# Patient Record
Sex: Female | Born: 1960
Health system: Southern US, Community
[De-identification: ages and names within clinical notes are randomized; demographics above are authoritative.]

## PROBLEM LIST (undated history)

## (undated) DIAGNOSIS — N200 Calculus of kidney: Secondary | ICD-10-CM

## (undated) DIAGNOSIS — G4700 Insomnia, unspecified: Secondary | ICD-10-CM

## (undated) DIAGNOSIS — N393 Stress incontinence (female) (male): Secondary | ICD-10-CM

## (undated) DIAGNOSIS — R112 Nausea with vomiting, unspecified: Secondary | ICD-10-CM

## (undated) DIAGNOSIS — F419 Anxiety disorder, unspecified: Secondary | ICD-10-CM

## (undated) DIAGNOSIS — F32A Depression, unspecified: Secondary | ICD-10-CM

## (undated) DIAGNOSIS — Z9889 Other specified postprocedural states: Secondary | ICD-10-CM

## (undated) DIAGNOSIS — F329 Major depressive disorder, single episode, unspecified: Secondary | ICD-10-CM

## (undated) DIAGNOSIS — G43909 Migraine, unspecified, not intractable, without status migrainosus: Secondary | ICD-10-CM

## (undated) HISTORY — DX: Major depressive disorder, single episode, unspecified: F32.9

## (undated) HISTORY — DX: Depression, unspecified: F32.A

## (undated) HISTORY — DX: Stress incontinence (female) (male): N39.3

## (undated) HISTORY — PX: COLONOSCOPY: SHX174

## (undated) HISTORY — DX: Migraine, unspecified, not intractable, without status migrainosus: G43.909

## (undated) HISTORY — DX: Nausea with vomiting, unspecified: R11.2

## (undated) HISTORY — DX: Insomnia, unspecified: G47.00

## (undated) HISTORY — DX: Other specified postprocedural states: Z98.890

## (undated) HISTORY — DX: Anxiety disorder, unspecified: F41.9

---

## 1997-10-31 ENCOUNTER — Other Ambulatory Visit: Admission: RE | Admit: 1997-10-31 | Discharge: 1997-10-31 | Payer: Self-pay | Admitting: Obstetrics and Gynecology

## 1998-11-08 ENCOUNTER — Other Ambulatory Visit: Admission: RE | Admit: 1998-11-08 | Discharge: 1998-11-08 | Payer: Self-pay | Admitting: Obstetrics and Gynecology

## 1999-11-21 ENCOUNTER — Other Ambulatory Visit: Admission: RE | Admit: 1999-11-21 | Discharge: 1999-11-21 | Payer: Self-pay | Admitting: Obstetrics and Gynecology

## 2000-08-26 ENCOUNTER — Emergency Department (HOSPITAL_COMMUNITY): Admission: EM | Admit: 2000-08-26 | Discharge: 2000-08-27 | Payer: Self-pay | Admitting: Emergency Medicine

## 2000-08-27 ENCOUNTER — Encounter: Payer: Self-pay | Admitting: Emergency Medicine

## 2001-01-03 ENCOUNTER — Other Ambulatory Visit: Admission: RE | Admit: 2001-01-03 | Discharge: 2001-01-03 | Payer: Self-pay | Admitting: Obstetrics and Gynecology

## 2003-02-24 HISTORY — PX: FOOT SURGERY: SHX648

## 2003-06-04 ENCOUNTER — Encounter: Admission: RE | Admit: 2003-06-04 | Discharge: 2003-08-02 | Payer: Self-pay | Admitting: Orthopedic Surgery

## 2003-10-30 ENCOUNTER — Encounter: Admission: RE | Admit: 2003-10-30 | Discharge: 2004-01-28 | Payer: Self-pay | Admitting: Orthopedic Surgery

## 2003-12-19 ENCOUNTER — Other Ambulatory Visit: Admission: RE | Admit: 2003-12-19 | Discharge: 2003-12-19 | Payer: Self-pay | Admitting: Internal Medicine

## 2004-02-24 HISTORY — PX: LAPAROSCOPIC APPENDECTOMY: SUR753

## 2005-02-18 ENCOUNTER — Encounter (INDEPENDENT_AMBULATORY_CARE_PROVIDER_SITE_OTHER): Payer: Self-pay | Admitting: Specialist

## 2005-02-18 ENCOUNTER — Observation Stay (HOSPITAL_COMMUNITY): Admission: EM | Admit: 2005-02-18 | Discharge: 2005-02-20 | Payer: Self-pay | Admitting: Emergency Medicine

## 2005-03-13 ENCOUNTER — Other Ambulatory Visit: Admission: RE | Admit: 2005-03-13 | Discharge: 2005-03-13 | Payer: Self-pay | Admitting: Obstetrics and Gynecology

## 2005-06-09 ENCOUNTER — Encounter: Admission: RE | Admit: 2005-06-09 | Discharge: 2005-06-09 | Payer: Self-pay | Admitting: Obstetrics and Gynecology

## 2005-11-24 ENCOUNTER — Encounter: Admission: RE | Admit: 2005-11-24 | Discharge: 2005-11-24 | Payer: Self-pay | Admitting: Obstetrics and Gynecology

## 2007-12-29 ENCOUNTER — Other Ambulatory Visit: Admission: RE | Admit: 2007-12-29 | Discharge: 2007-12-29 | Payer: Self-pay | Admitting: Obstetrics & Gynecology

## 2008-01-03 ENCOUNTER — Encounter: Admission: RE | Admit: 2008-01-03 | Discharge: 2008-01-03 | Payer: Self-pay | Admitting: Obstetrics & Gynecology

## 2009-03-26 HISTORY — PX: URETHRAL SLING: SHX2621

## 2009-03-28 ENCOUNTER — Ambulatory Visit (HOSPITAL_BASED_OUTPATIENT_CLINIC_OR_DEPARTMENT_OTHER): Admission: RE | Admit: 2009-03-28 | Discharge: 2009-03-28 | Payer: Self-pay | Admitting: Urology

## 2010-02-26 ENCOUNTER — Encounter
Admission: RE | Admit: 2010-02-26 | Discharge: 2010-02-26 | Payer: Self-pay | Source: Home / Self Care | Attending: Obstetrics & Gynecology | Admitting: Obstetrics & Gynecology

## 2010-02-26 LAB — HM MAMMOGRAPHY: HM Mammogram: NORMAL

## 2010-05-14 LAB — CBC
HCT: 40.7 % (ref 36.0–46.0)
MCV: 89.5 fL (ref 78.0–100.0)
RBC: 4.55 MIL/uL (ref 3.87–5.11)
WBC: 4.3 10*3/uL (ref 4.0–10.5)

## 2010-05-14 LAB — TYPE AND SCREEN
ABO/RH(D): AB NEG
Antibody Screen: NEGATIVE

## 2010-05-14 LAB — ABO/RH: ABO/RH(D): AB NEG

## 2010-05-14 LAB — APTT: aPTT: 28 seconds (ref 24–37)

## 2010-07-11 NOTE — H&P (Signed)
Joy Petty, Joy Petty        ACCOUNT NO.:  [id number]   MEDICAL RECORD NO.:  [id number]          PATIENT TYPE:  EMS   LOCATION:  ED                           FACILITY:  [facility]   PHYSICIAN:  Vikki Ports, MDDATE OF BIRTH:  1960/09/06   DATE OF ADMISSION:  02/18/2005  DATE OF DISCHARGE:                                HISTORY & PHYSICAL   CHIEF COMPLAINT:  Right lower quadrant pain.   DIAGNOSIS:  Acute appendicitis.   HISTORY OF PRESENT ILLNESS:  This is a 50 year old white female I was asked  to evaluate by Dr. Crista Luria.  The patient presented with a 10-hour  history of periumbical pain localizing to the right lower quadrant.  She was  sent to the [facility] emergency room and found to have a white count of  15,000.  She was complaining of anorexia and 1 episode of emesis.  She had a  previous history of urolithiasis, but this pain was quite different.  She  complained of pain on the ride over, particularly with bumps in the road.   PAST MEDICAL HISTORY:  Significant only for urolithiasis.   PAST SURGICAL HISTORY:  Significant only for right toe surgery.   SOCIAL HISTORY:  Negative for tobacco and alcohol use.   FAMILY HISTORY:  Noncontributory.   DRUG ALLERGIES:  Significant only for SULFA.   MEDICATIONS:  1.  Wellbutrin 150 mg a day.  2.  Zoloft 10 mg a day.   REVIEW OF SYSTEMS:  Significant for a slight fever this morning at 99  degrees.   PHYSICAL EXAMINATION:  VITAL SIGNS:  Her temperature is 99 degrees, heart  rate is 86 heart beats per minute, respiratory rate is 16, blood pressure is  126/72.  GENERAL:  She is an age-appropriate white female in minimal distress.  HEENT:  Benign.  Normocephalic and atraumatic.  NECK:  Supple and soft without thyromegaly of cervical adenopathy.  LUNGS:  Clear to auscultation and percussion x2.  HEART:  Regular rate and rhythm without murmurs, rubs, or gallops.  ABDOMEN:  Soft.  Tender in the right lower quadrant  with positive rebound  and localized peritoneal signs.  EXTREMITIES:  No clubbing, cyanosis, or edema.  White count is 15,000.   IMPRESSION:  Acute appendicitis.   PLAN:  Laparoscopic appendectomy.      Vikki Ports, MD  Electronically Signed     KRH/MEDQ  D:  02/18/2005  T:  02/18/2005  Job:  [id number]

## 2010-07-11 NOTE — Op Note (Signed)
[name], Joy Petty        ACCOUNT NO.:  [id number]   MEDICAL RECORD NO.:  [id number]          PATIENT TYPE:  OBV   LOCATION:  1514                         FACILITY:  [facility]   PHYSICIAN:  Alfonse Ras, MD   DATE OF BIRTH:  July 30, 1960   DATE OF PROCEDURE:  03/06/2005  DATE OF DISCHARGE:  02/20/2005                                 OPERATIVE REPORT   PREOPERATIVE DIAGNOSIS:  Acute appendicitis.   POSTOPERATIVE DIAGNOSIS:  Acute appendicitis.   PROCEDURE:  Laparoscopic appendectomy.   SURGEON:  Alfonse Ras, MD.   ANESTHESIA:  General.   DESCRIPTION OF PROCEDURE:  The patient was taken to the operating room,  placed in a supine position and after general anesthesia was induced, the  abdomen was prepped and draped in a normal sterile fashion. Using a 12 mm  trocar in the left upper quadrant using direct vision, peritoneal access was  obtained. Pneumoperitoneum was obtained using continuous flow carbon  dioxide. There was some bleeding from the omentum on the access of the  abdomen and this was visualized. Additional 12 and 5 mm trocars were placed  in the left abdomen and the omental bleeding was irrigated and controlled  with the harmonic scalpel. This appeared to be about 400 mL or so. The  suppurative appendix was visualized and mobilized. The mesoappendix was  taken down with harmonic scalpel and the base was transected using a GIA  stapling device, placed in EndoCatch bag and removed through the 12 mm  trocar. Again the left upper quadrant was irrigated and clots were removed.  The area of omental bleeding was again inspected and no active bleeding was  seen but a drain was placed in the area of previous bleeding for further  inspection. The pneumoperitoneum was released, trocars were removed, drain  was sewed in placed. The skin incisions were closed with subcuticular 4-0  Monocryl. Steri-Strips and sterile dressings were applied. The patient  tolerated the  procedure well and went to PACU in good condition.      Alfonse Ras, MD  Electronically Signed     KRE/MEDQ  D:  03/06/2005  T:  03/07/2005  Job:  [id number]

## 2011-02-12 LAB — HM PAP SMEAR: HM Pap smear: NEGATIVE

## 2011-03-10 ENCOUNTER — Institutional Professional Consult (permissible substitution): Payer: Self-pay | Admitting: Cardiology

## 2012-01-04 ENCOUNTER — Ambulatory Visit: Payer: Self-pay | Admitting: Cardiology

## 2012-01-15 ENCOUNTER — Ambulatory Visit (INDEPENDENT_AMBULATORY_CARE_PROVIDER_SITE_OTHER): Payer: BC Managed Care – PPO | Admitting: Cardiology

## 2012-01-15 ENCOUNTER — Encounter: Payer: Self-pay | Admitting: Cardiology

## 2012-01-15 ENCOUNTER — Ambulatory Visit (INDEPENDENT_AMBULATORY_CARE_PROVIDER_SITE_OTHER)
Admission: RE | Admit: 2012-01-15 | Discharge: 2012-01-15 | Disposition: A | Discharge status: Home / Self Care | Payer: BC Managed Care – PPO | Source: Ambulatory Visit | Attending: Cardiology | Admitting: Cardiology

## 2012-01-15 VITALS — BP 130/81 | HR 88 | Ht 62.0 in | Wt 166.0 lb

## 2012-01-15 DIAGNOSIS — R0989 Other specified symptoms and signs involving the circulatory and respiratory systems: Secondary | ICD-10-CM

## 2012-01-15 DIAGNOSIS — R0600 Dyspnea, unspecified: Secondary | ICD-10-CM

## 2012-01-15 DIAGNOSIS — R0609 Other forms of dyspnea: Secondary | ICD-10-CM

## 2012-01-15 DIAGNOSIS — Z8249 Family history of ischemic heart disease and other diseases of the circulatory system: Secondary | ICD-10-CM | POA: Insufficient documentation

## 2012-01-15 NOTE — Patient Instructions (Signed)
Your physician recommends that you continue on your current medications as directed. Please refer to the Current Medication list given to you today.  Will have you go for a chest xray today and call you with the results  Return soon for fasting labs (lipid panel)  Your physician has requested that you have an exercise tolerance test. For further information please visit [email or web address] Please also follow instruction sheet, as given.

## 2012-01-15 NOTE — Progress Notes (Signed)
Reason for Consult: Risk factors for coronary artery disease Referring Physician: Dr. Leda Quail  Joy Petty is an 51 y.o. female.  HPI: This patient is seen for the first time today.  Her mother is a patient of mine and her father was a patient of mine before his death.  The patient comes in with her concerned about her strong family history of ischemic heart disease.  She was to be sure that she is doing all that she can do to keep from having ischemic heart disease herself.  Her family history shows that her father developed ischemic heart disease in his 89s and had coronary artery bypass graft surgery.  Her paternal grandfather had a myocardial infarction and early cardiac death.  She has a living 69 year old brother who has had multiple cardiac stents.  Her mother is in good health and has not had ischemic heart disease. The patient has had some exertional dyspnea.  She has not been experiencing any precordial chest pain to suggest angina.  He does not have any prior history of hypercholesterolemia that she knows of.  She has not had problems with high blood pressure or diabetes.  Her weight has been up and down over the years.  She does try to exercise by walking about 4 times a week and also does yoga Her social history reveals that she is married.  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 Her past medical history is positive for problems with anxiety and depression and at the present time she is on Pristiq.      Social History: The patient does not use tobacco and she does not drink alcohol        Dg Chest 2 View  01/15/2012  *RADIOLOGY REPORT*  Clinical Data: Dyspnea  CHEST - 2 VIEW  Comparison: None.  Findings: Lungs are clear. No pleural effusion or pneumothorax.  Cardiomediastinal silhouette is within normal limits.  Mild degenerative changes of the visualized thoracolumbar spine.  IMPRESSION: No evidence of  acute cardiopulmonary disease.   Original Report Authenticated By: Charline Bills, M.D.    Review of systems is negative except for present illness Blood pressure 130/81, pulse 88, height 5\' 2"  (1.575 m), weight 166 lb (75.297 kg). The patient appears to be in no distress.  Head and neck exam reveals that the pupils are equal and reactive.  The extraocular movements are full.  There is no scleral icterus.  Mouth and pharynx are benign.  No lymphadenopathy.  No carotid bruits.  The jugular venous pressure is normal.  Thyroid is not enlarged or tender.  Chest is clear to percussion and auscultation.  No rales or rhonchi.  Expansion of the chest is symmetrical.  Heart reveals no abnormal lift or heave.  First and second heart sounds are normal.  There is no murmur gallop rub or click.  The abdomen is soft and nontender.  Bowel sounds are normoactive.  There is no hepatosplenomegaly or mass.  There are no abdominal bruits.  Extremities reveal no phlebitis or edema.  Pedal pulses are good.  There is no cyanosis or clubbing.  Neurologic exam is normal strength and no lateralizing weakness.  No sensory deficits.  Integument reveals no rash   Assessment/Plan: Her physical examination is unremarkable.  Her electrocardiogram is normal today.  We will assess additional risk factors with a chest x-ray to look at heart size and she will return for a fasting lipid  panel soon.  We will also have her return for a Bruce treadmill stress test to evaluate her for her history of mild exertional dyspnea.  Many thanks for the opportunity to see this pleasant woman with you.  No new medications indicated at this point.  Cassell Clement 01/15/2012, 5:59 PM

## 2012-01-19 ENCOUNTER — Telehealth: Payer: Self-pay | Admitting: *Deleted

## 2012-01-19 NOTE — Telephone Encounter (Signed)
Message copied by Burnell Blanks on Tue Jan 19, 2012  5:44 PM ------      Message from: Cassell Clement      Created: Sun Jan 17, 2012  5:49 PM       Chest xray normal.  Heart size normal.

## 2012-01-27 ENCOUNTER — Other Ambulatory Visit (INDEPENDENT_AMBULATORY_CARE_PROVIDER_SITE_OTHER): Payer: BC Managed Care – PPO

## 2012-01-27 DIAGNOSIS — R0609 Other forms of dyspnea: Secondary | ICD-10-CM

## 2012-01-27 DIAGNOSIS — R0989 Other specified symptoms and signs involving the circulatory and respiratory systems: Secondary | ICD-10-CM

## 2012-01-27 DIAGNOSIS — R0600 Dyspnea, unspecified: Secondary | ICD-10-CM

## 2012-01-27 LAB — LIPID PANEL
Cholesterol: 177 mg/dL (ref 0–200)
LDL Cholesterol: 110 mg/dL — ABNORMAL HIGH (ref 0–99)

## 2012-02-01 ENCOUNTER — Ambulatory Visit (INDEPENDENT_AMBULATORY_CARE_PROVIDER_SITE_OTHER): Payer: BC Managed Care – PPO | Admitting: Physician Assistant

## 2012-02-01 DIAGNOSIS — R0600 Dyspnea, unspecified: Secondary | ICD-10-CM

## 2012-02-01 DIAGNOSIS — R0609 Other forms of dyspnea: Secondary | ICD-10-CM

## 2012-02-01 NOTE — Progress Notes (Signed)
Exercise Treadmill Test  Pre-Exercise Testing Evaluation Rhythm: normal sinus  Rate: 78                 Test  Exercise Tolerance Test Ordering MD: Cassell Clement, MD  Interpreting MD: Tereso Newcomer, PA-C  Unique Test No: 1  Treadmill:  1  Indication for ETT: exertional dyspnea  Contraindication to ETT: No   Stress Modality: exercise - treadmill  Cardiac Imaging Performed: non   Protocol: standard Bruce - maximal  Max BP:  183/82  Max MPHR (bpm):  167 85% MPR (bpm):  142  MPHR obtained (bpm):  162 % MPHR obtained:  98  Reached 85% MPHR (min:sec):  3:41   Total Exercise Time (min-sec):  7:01  Workload in METS:  8.5 Borg Scale: 16  Reason ETT Terminated:  patient's desire to stop    ST Segment Analysis At Rest: normal ST segments - no evidence of significant ST depression With Exercise: non-specific ST changes  Other Information Arrhythmia:  No Angina during ETT:  absent (0) Quality of ETT:  diagnostic  ETT Interpretation:  normal - no evidence of ischemia by ST analysis  Comments: Fair exercise tolerance. No chest pain. Normal BP response to exercise. No ST-T changes to suggest ischemia.   Recommendations: Follow up with Dr. Cassell Clement as directed. Signed, Tereso Newcomer, PA-C  3:20 PM 02/01/2012

## 2012-02-01 NOTE — Progress Notes (Signed)
Stress test was normal.  Please report.

## 2012-02-25 ENCOUNTER — Telehealth: Payer: Self-pay | Admitting: Cardiology

## 2012-02-25 NOTE — Telephone Encounter (Signed)
New Problem:    Patient returned Melinda's previous call.  Please call back.

## 2012-02-25 NOTE — Telephone Encounter (Signed)
I spoke with the pt and made her aware of 01/27/12 lipid results.  I also made the pt aware that Dr Patty Sermons reviewed her GXT and this was normal. Copy of labs mailed to the pt per her request.

## 2012-06-14 ENCOUNTER — Encounter: Payer: Self-pay | Admitting: Obstetrics & Gynecology

## 2012-06-16 ENCOUNTER — Ambulatory Visit (INDEPENDENT_AMBULATORY_CARE_PROVIDER_SITE_OTHER): Payer: BC Managed Care – PPO | Admitting: Obstetrics & Gynecology

## 2012-06-16 ENCOUNTER — Encounter: Payer: Self-pay | Admitting: Obstetrics & Gynecology

## 2012-06-16 VITALS — BP 128/82 | Ht 63.0 in | Wt 174.8 lb

## 2012-06-16 DIAGNOSIS — Z0000 Encounter for general adult medical examination without abnormal findings: Secondary | ICD-10-CM

## 2012-06-16 DIAGNOSIS — Z01419 Encounter for gynecological examination (general) (routine) without abnormal findings: Secondary | ICD-10-CM

## 2012-06-16 LAB — POCT URINALYSIS DIPSTICK
Bilirubin, UA: NEGATIVE
Glucose, UA: NEGATIVE
Nitrite, UA: NEGATIVE
Urobilinogen, UA: NEGATIVE
pH, UA: 5

## 2012-06-16 LAB — COMPREHENSIVE METABOLIC PANEL
ALT: 21 U/L (ref 0–35)
BUN: 16 mg/dL (ref 6–23)
CO2: 28 mEq/L (ref 19–32)
Calcium: 9.8 mg/dL (ref 8.4–10.5)
Chloride: 103 mEq/L (ref 96–112)
Creat: 0.69 mg/dL (ref 0.50–1.10)
Glucose, Bld: 83 mg/dL (ref 70–99)
Total Bilirubin: 0.6 mg/dL (ref 0.3–1.2)

## 2012-06-16 LAB — TSH: TSH: 2.364 u[IU]/mL (ref 0.350–4.500)

## 2012-06-16 MED ORDER — PHENTERMINE HCL 37.5 MG PO CAPS: 37.5000 mg | ORAL_CAPSULE | ORAL | Status: DC

## 2012-06-16 MED ORDER — DESVENLAFAXINE SUCCINATE ER 50 MG PO TB24: 50.0000 mg | ORAL_TABLET | Freq: Every day | ORAL | Status: DC

## 2012-06-16 NOTE — Patient Instructions (Signed)

## 2012-06-16 NOTE — Progress Notes (Signed)
52 y.o. [id number] MarriedCaucasianF here for annual exam.  No vaginal bleeding.  Going to [place] in September.  Going for 10-14 days.  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    Patient's last menstrual period was 02/23/2009.          Sexually active: yes  The current method of family planning is vasectomy.    Exercising: yes  walking and exercise videos Smoker:  no  Health Maintenance: Pap:  02/12/11 WNL/negative HR HPV MMG:  02/26/10 normal Colonoscopy:  3/12 repeat 10 years BMD:   Heel test TDaP:  2005 Labs: Doing today   reports that she has never smoked. She has never used smokeless tobacco. She reports that she drinks about 6.0 ounces of alcohol per week. She reports that she does not use illicit drugs.  Past Medical History  Diagnosis Date  . Depression     anxiety  . Insomnia     Past Surgical History  Procedure Laterality Date  . Laparoscopic appendectomy  2006  . Foot surgery  2005    right foot  . Colonoscopy  3/12  . Urethral sling  2/11    Dr Sherron Monday     Current Outpatient Prescriptions  Medication Sig Dispense Refill  . desvenlafaxine (PRISTIQ) 50 MG 24 hr tablet Take 50 mg by mouth daily.      . diphenhydramine-acetaminophen (TYLENOL PM) 25-500 MG TABS Take 1 tablet by mouth at bedtime as needed.      . Nutritional Supplements (JUICE PLUS FIBRE PO) Take by mouth.      . Pseudoeph-Doxylamine-DM-APAP (NYQUIL PO) Take by mouth as needed.       No current facility-administered medications for this visit.    Family History  Problem Relation Age of Onset  . Hypertension Mother   . Thyroid disease Mother   . Cancer Mother     lung  . Diabetes Paternal Grandfather   . Heart disease Father   . Heart disease Brother     ROS:  Pertinent items are noted in HPI.  Otherwise, a comprehensive ROS was negative.  Exam:   BP 128/82  Ht 5\' 3"  (1.6 m)  Wt 174 lb 12.8 oz (79.289 kg)  BMI 30.97 kg/m2  LMP 02/23/2009  W Height:   Height: 5\' 3"  (160 cm)  Ht Readings  from Last 3 Encounters:  06/16/12 5\' 3"  (1.6 m)  01/15/12 5\' 2"  (1.575 m)    General appearance: alert, cooperative and appears stated age Head: Normocephalic, without obvious abnormality, atraumatic Neck: no adenopathy, supple, symmetrical, trachea midline and thyroid normal to inspection and palpation Lungs: clear to auscultation bilaterally Breasts: normal appearance, no masses or tenderness Heart: regular rate and rhythm Abdomen: soft, non-tender; bowel sounds normal; no masses,  no organomegaly Extremities: extremities normal, atraumatic, no cyanosis or edema Skin: Skin color, texture, turgor normal. No rashes or lesions Lymph nodes: Cervical, supraclavicular, and axillary nodes normal. No abnormal inguinal nodes palpated Neurologic: Grossly normal   Pelvic: External genitalia:  no lesions              Urethra:  normal appearing urethra with no masses, tenderness or lesions              Bartholins and Skenes: normal                 Vagina: normal appearing vagina with normal color and discharge, no lesions              Cervix: no lesions  Pap taken: no Bimanual Exam:  Uterus:  normal size, contour, position, consistency, mobility, non-tender              Adnexa: no mass, fullness, tenderness               Rectovaginal: Confirms               Anus:  normal sphincter tone, no lesions  A:  Well Woman with normal exam Strong family hx of CVD--saw Dr. Patty Sermons 2013 PMP, no HRT OAB after mid urethral sling placement--not on medication Desirous of weight loss H/O depression/anxiety  P:   Mammogram, we need to schedule for pt pap smear 2013 neg with neg HR HPV Prestiq 50mg  qd Restart phentermine 37.5mg  q day.  Recheck 6 wks CMP, TSH, Vit D return annually or prn  An After Visit Summary was printed and given to the patient.

## 2012-06-17 ENCOUNTER — Telehealth: Payer: Self-pay | Admitting: Obstetrics & Gynecology

## 2012-06-17 ENCOUNTER — Other Ambulatory Visit: Payer: Self-pay | Admitting: Obstetrics & Gynecology

## 2012-06-17 DIAGNOSIS — Z1231 Encounter for screening mammogram for malignant neoplasm of breast: Secondary | ICD-10-CM

## 2012-06-17 NOTE — Telephone Encounter (Signed)
Spoke with pt who reports she was able to get her questions answered. No further needs at this time.

## 2012-06-17 NOTE — Telephone Encounter (Signed)
pt was seen yesterday and has some questions

## 2012-06-21 ENCOUNTER — Ambulatory Visit
Admission: RE | Admit: 2012-06-21 | Discharge: 2012-06-21 | Disposition: A | Discharge status: Home / Self Care | Payer: BC Managed Care – PPO | Source: Ambulatory Visit | Attending: Obstetrics & Gynecology | Admitting: Obstetrics & Gynecology

## 2012-06-21 DIAGNOSIS — Z1231 Encounter for screening mammogram for malignant neoplasm of breast: Secondary | ICD-10-CM

## 2012-07-28 ENCOUNTER — Other Ambulatory Visit: Payer: BC Managed Care – PPO

## 2012-07-29 ENCOUNTER — Telehealth: Payer: Self-pay | Admitting: Obstetrics & Gynecology

## 2012-07-29 NOTE — Telephone Encounter (Signed)
Patient DNKA 6 week lab appointment 07/28/12. I left a message for the patient to call and reschedule.

## 2012-10-15 ENCOUNTER — Other Ambulatory Visit: Payer: Self-pay | Admitting: Obstetrics & Gynecology

## 2012-11-03 ENCOUNTER — Telehealth: Payer: Self-pay | Admitting: Obstetrics & Gynecology

## 2012-11-03 NOTE — Telephone Encounter (Signed)
Patient needs to know if she needs lab work because of the medication that she is taking. Phentermine?

## 2012-11-03 NOTE — Telephone Encounter (Signed)
Spoke with pt who started taking phentermine about 5 weeks ago. Pt did not start it right after it was prescribed so she cancelled her lab appt on 07-28-12 that was to be 6 weeks after last visit. Pt wanted to reschedule lab appt, but she does not know what labs she needs. Please advise and I will schedule.

## 2012-11-03 NOTE — Telephone Encounter (Signed)
Not labs in 6 weeks.  Recheck with me in 6 weeks.  Please make appt.  She will be weighed when she comes in and I will check BP and see if she is having any side effects.

## 2012-11-04 NOTE — Telephone Encounter (Signed)
Spoke with pt about follow up appt for weight, BP check, and assessment for side effects on med. Sched OV 11-07-12 at 2 pm with SM.

## 2012-11-04 NOTE — Telephone Encounter (Signed)
LMTCB about appt to see SM for checkup.  aa

## 2012-11-07 ENCOUNTER — Ambulatory Visit (INDEPENDENT_AMBULATORY_CARE_PROVIDER_SITE_OTHER): Payer: BC Managed Care – PPO | Admitting: Obstetrics & Gynecology

## 2012-11-07 VITALS — BP 124/78 | HR 60 | Resp 16 | Ht 62.75 in | Wt 171.0 lb

## 2012-11-07 DIAGNOSIS — F3289 Other specified depressive episodes: Secondary | ICD-10-CM

## 2012-11-07 DIAGNOSIS — F32A Depression, unspecified: Secondary | ICD-10-CM

## 2012-11-07 DIAGNOSIS — F329 Major depressive disorder, single episode, unspecified: Secondary | ICD-10-CM

## 2012-11-07 DIAGNOSIS — R51 Headache: Secondary | ICD-10-CM

## 2012-11-07 MED ORDER — DESVENLAFAXINE SUCCINATE ER 100 MG PO TB24: ORAL_TABLET | ORAL | Status: DC

## 2012-11-07 NOTE — Progress Notes (Signed)
52 y.o. Married Caucasian female [phone] here for follow up of desired weight loss.  She started the phentermine about six weeks ago.  Has not had much weight loss but reports worsening headaches on the phentermine.  Headaches have been a big issue for pt even before starting the phentermine.  Wants recommendations.  Has never had w/u for this.  Feel she needs referral to [facility] for this.  Pt going to [place] next week and going to [place] for 3 days.  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  Very excited just hopes she feels well while there.  Advised stopping phentermine today.  Pt understands.  Second issue she wants to discuss  is depression that she has struggled with for years.  On Prestiq 50mg  daily but not sure that is doing her any good.  She, particularly, missed her daughter.  Relationship is now good although daughter has caused significant stressors for patient in past, an even occasionally now.  Son doing well.  Married.  Relationship with him good, too, although he is often jealous of how she talks about missing daughter.  Relationship with husband good.  Pt not really interested in therapist or psychiatrist but may need to go if changes to medication do not help.  No homicidal or suicidal ideation.    O: Healthy WD,WN female Affect: normal  A:Headaches Depression  P: Stop phentermine [facility] Increase prestiq to 100mg  daily.  Rx to pharmacy.    ~25 minutes spent with patient >50% of time was in face to face discussion of above.

## 2012-11-19 ENCOUNTER — Encounter: Payer: Self-pay | Admitting: Obstetrics & Gynecology

## 2012-11-19 DIAGNOSIS — R519 Headache, unspecified: Secondary | ICD-10-CM | POA: Insufficient documentation

## 2012-11-19 DIAGNOSIS — R51 Headache: Secondary | ICD-10-CM | POA: Insufficient documentation

## 2012-11-19 NOTE — Patient Instructions (Signed)
Please call and give and update once you have increased the prestiq.  Call in 3-4 weeks.

## 2012-12-08 ENCOUNTER — Telehealth: Payer: Self-pay | Admitting: Obstetrics & Gynecology

## 2012-12-08 NOTE — Telephone Encounter (Signed)
Called [facility] to follow up on a referral for this patient. She had not responded to the letter they sent her.   Called the patient to follow up with her in regards to the referral we submitted for her. She said [place] was out [place] when they tried to contact her about scheduling. She said she stopped taking phentermine and has not had a headache since. Patient will contact the headache clinic if they return and schedule at that time.

## 2013-08-30 ENCOUNTER — Ambulatory Visit: Payer: BC Managed Care – PPO | Admitting: Obstetrics & Gynecology

## 2013-09-01 ENCOUNTER — Ambulatory Visit: Payer: BC Managed Care – PPO | Admitting: Obstetrics & Gynecology

## 2013-09-01 ENCOUNTER — Telehealth: Payer: Self-pay | Admitting: Obstetrics & Gynecology

## 2013-09-01 NOTE — Telephone Encounter (Signed)
Left message for patient with her husband cancelling her appointment with Dr. Jeanie Cooks today. Patient will call back to reschedule.

## 2013-09-01 NOTE — Telephone Encounter (Signed)
Rescheduled

## 2013-10-23 ENCOUNTER — Encounter: Payer: Self-pay | Admitting: Obstetrics & Gynecology

## 2013-10-23 ENCOUNTER — Ambulatory Visit (INDEPENDENT_AMBULATORY_CARE_PROVIDER_SITE_OTHER): Payer: 59 | Admitting: Obstetrics & Gynecology

## 2013-10-23 VITALS — BP 120/80 | HR 72 | Ht 62.5 in | Wt 177.0 lb

## 2013-10-23 DIAGNOSIS — Z01419 Encounter for gynecological examination (general) (routine) without abnormal findings: Secondary | ICD-10-CM

## 2013-10-23 DIAGNOSIS — Z23 Encounter for immunization: Secondary | ICD-10-CM

## 2013-10-23 DIAGNOSIS — M79674 Pain in right toe(s): Secondary | ICD-10-CM

## 2013-10-23 DIAGNOSIS — Z0000 Encounter for general adult medical examination without abnormal findings: Secondary | ICD-10-CM

## 2013-10-23 DIAGNOSIS — Z1239 Encounter for other screening for malignant neoplasm of breast: Secondary | ICD-10-CM

## 2013-10-23 DIAGNOSIS — M79609 Pain in unspecified limb: Secondary | ICD-10-CM

## 2013-10-23 DIAGNOSIS — Z124 Encounter for screening for malignant neoplasm of cervix: Secondary | ICD-10-CM

## 2013-10-23 LAB — POCT URINALYSIS DIPSTICK
Bilirubin, UA: NEGATIVE
Glucose, UA: NEGATIVE
Ketones, UA: NEGATIVE
Nitrite, UA: NEGATIVE
PH UA: 5
PROTEIN UA: NEGATIVE
UROBILINOGEN UA: NEGATIVE

## 2013-10-23 LAB — HEMOGLOBIN, FINGERSTICK: Hemoglobin, fingerstick: 14.7 g/dL (ref 12.0–16.0)

## 2013-10-23 LAB — URIC ACID: Uric Acid, Serum: 5.6 mg/dL (ref 2.4–7.0)

## 2013-10-23 MED ORDER — TETANUS-DIPHTH-ACELL PERTUSSIS 5-2.5-18.5 LF-MCG/0.5 IM SUSP: 0.5000 mL | Freq: Once | INTRAMUSCULAR | Status: DC

## 2013-10-23 MED ORDER — DESVENLAFAXINE SUCCINATE ER 100 MG PO TB24: ORAL_TABLET | ORAL | Status: DC

## 2013-10-23 NOTE — Progress Notes (Signed)
53 y.o. [id number] MarriedCaucasianF here for annual exam.  No vaginal bleeding.  Doing well except reports issues with pain and swelling in right big toe.  H/O arthritis in right knee.  Has noted some heat in her toe as well.   Patient's last menstrual period was 02/23/2009.          Sexually active: No.  The current method of family planning is post menopausal status.    Exercising: Yes.    Home exercise routine includes walking and exercise videos. Smoker:  no  Health Maintenance: Pap:  02/12/11 WNL neg HR HPV History of abnormal Pap:  no MMG:  06/21/12 Bi-Rads Neg Colonoscopy:  3/12 repeat in 10 years (per pt), [facility] BMD:   Heel test a long time ago TDaP:  2005 Screening Labs: lipids 2013 and CMP/TSH/Vit D 2014, Hb today: 14.7, Urine today: RBC-trace, WBC-trace, ph: 5.0   reports that she has never smoked. She has never used smokeless tobacco. She reports that she drinks about 6 ounces of alcohol per week. She reports that she does not use illicit drugs.  Past Medical History  Diagnosis Date  . Depression     anxiety  . Insomnia   . SUI (stress urinary incontinence, female)     Past Surgical History  Procedure Laterality Date  . Laparoscopic appendectomy  2006  . Foot surgery  2005    right foot  . Urethral sling  2/11    Dr Matilde Sprang     Current Outpatient Prescriptions  Medication Sig Dispense Refill  . desvenlafaxine (PRISTIQ) 100 MG 24 hr tablet 1 po q day  30 tablet  6  . diphenhydramine-acetaminophen (TYLENOL PM) 25-500 MG TABS Take 1 tablet by mouth at bedtime as needed.      . Nutritional Supplements (JUICE PLUS FIBRE PO) Take by mouth.      . Pseudoeph-Doxylamine-DM-APAP (NYQUIL PO) Take by mouth as needed.       No current facility-administered medications for this visit.    Family History  Problem Relation Age of Onset  . Hypertension Mother   . Thyroid disease Mother   . Cancer Mother     lung  . Diabetes Paternal Grandfather   .  Heart disease Father   . Heart disease Brother     ROS:  Pertinent items are noted in HPI.  Otherwise, a comprehensive ROS was negative.  Exam:   BP 120/80  Pulse 72  Ht 5' 2.5" (1.588 m)  Wt 177 lb (80.287 kg)  BMI 31.84 kg/m2  LMP 02/23/2009  Weight change: +3#  Height: 5' 2.5" (158.8 cm)  Ht Readings from Last 3 Encounters:  10/23/13 5' 2.5" (1.588 m)  11/07/12 5' 2.75" (1.594 m)  06/16/12 5\' 3"  (1.6 m)    General appearance: alert, cooperative and appears stated age Head: Normocephalic, without obvious abnormality, atraumatic Neck: no adenopathy, supple, symmetrical, trachea midline and thyroid normal to inspection and palpation Lungs: clear to auscultation bilaterally Breasts: normal appearance, no masses or tenderness Heart: regular rate and rhythm Abdomen: soft, non-tender; bowel sounds normal; no masses,  no organomegaly Extremities: extremities normal, atraumatic, no cyanosis or edema Skin: Skin color, texture, turgor normal. No rashes or lesions Lymph nodes: Cervical, supraclavicular, and axillary nodes normal. No abnormal inguinal nodes palpated Neurologic: Grossly normal   Pelvic: External genitalia:  no lesions except for a single sebaceous cyst in right labia              Urethra:  normal  appearing urethra with no masses, tenderness or lesions              Bartholins and Skenes: normal                 Vagina: normal appearing vagina with normal color and discharge, no lesions              Cervix: no lesions              Pap taken: Yes.   Bimanual Exam:  Uterus:  normal size, contour, position, consistency, mobility, non-tender              Adnexa: normal adnexa and no mass, fullness, tenderness               Rectovaginal: Confirms               Anus:  normal sphincter tone, no lesions  A:  Well Woman with normal exam  Strong family hx of CVD--saw Dr. Mare Ferrari 2013  PMP, no HRT  OAB after mid urethral sling placement--not on medication  Desirous of weight  loss  H/O depression/anxiety Right toe pain   P: Mammogram, we need to schedule for pt  pap smear 2012 neg with neg HR HPV.  Pap today. Prestiq 50mg  qd   Uric acid.  If normal, may need referral to ortho. return annually or prn  An After Visit Summary was printed and given to the patient.

## 2013-10-23 NOTE — Addendum Note (Signed)
Addended by: Gerda Diss on: 10/23/2013 03:17 PM   Modules accepted: Orders

## 2013-10-25 LAB — IPS PAP TEST WITH HPV

## 2013-11-09 ENCOUNTER — Ambulatory Visit: Payer: BC Managed Care – PPO

## 2013-11-28 ENCOUNTER — Ambulatory Visit
Admission: RE | Admit: 2013-11-28 | Discharge: 2013-11-28 | Disposition: A | Discharge status: Home / Self Care | Payer: 59 | Source: Ambulatory Visit | Attending: Obstetrics & Gynecology | Admitting: Obstetrics & Gynecology

## 2013-11-28 ENCOUNTER — Encounter (INDEPENDENT_AMBULATORY_CARE_PROVIDER_SITE_OTHER): Payer: Self-pay

## 2013-11-28 ENCOUNTER — Other Ambulatory Visit: Payer: Self-pay | Admitting: Obstetrics & Gynecology

## 2013-11-28 DIAGNOSIS — Z1239 Encounter for other screening for malignant neoplasm of breast: Secondary | ICD-10-CM

## 2013-11-28 DIAGNOSIS — Z1231 Encounter for screening mammogram for malignant neoplasm of breast: Secondary | ICD-10-CM

## 2013-12-25 ENCOUNTER — Encounter: Payer: Self-pay | Admitting: Obstetrics & Gynecology

## 2014-05-25 HISTORY — PX: LITHOTRIPSY: SUR834

## 2014-05-25 HISTORY — PX: CYSTOSCOPY/RETROGRADE/URETEROSCOPY/STONE EXTRACTION WITH BASKET: SHX5317

## 2014-06-08 ENCOUNTER — Encounter (HOSPITAL_BASED_OUTPATIENT_CLINIC_OR_DEPARTMENT_OTHER): Payer: Self-pay

## 2014-06-08 ENCOUNTER — Emergency Department (HOSPITAL_BASED_OUTPATIENT_CLINIC_OR_DEPARTMENT_OTHER)
Admission: EM | Admit: 2014-06-08 | Discharge: 2014-06-08 | Disposition: A | Discharge status: Home / Self Care | Payer: 59 | Attending: Emergency Medicine | Admitting: Emergency Medicine

## 2014-06-08 ENCOUNTER — Emergency Department (HOSPITAL_BASED_OUTPATIENT_CLINIC_OR_DEPARTMENT_OTHER): Payer: 59

## 2014-06-08 DIAGNOSIS — N10 Acute tubulo-interstitial nephritis: Secondary | ICD-10-CM | POA: Insufficient documentation

## 2014-06-08 DIAGNOSIS — Z9049 Acquired absence of other specified parts of digestive tract: Secondary | ICD-10-CM | POA: Diagnosis not present

## 2014-06-08 DIAGNOSIS — R109 Unspecified abdominal pain: Secondary | ICD-10-CM

## 2014-06-08 DIAGNOSIS — F329 Major depressive disorder, single episode, unspecified: Secondary | ICD-10-CM | POA: Diagnosis not present

## 2014-06-08 DIAGNOSIS — N200 Calculus of kidney: Secondary | ICD-10-CM | POA: Diagnosis not present

## 2014-06-08 DIAGNOSIS — Z79899 Other long term (current) drug therapy: Secondary | ICD-10-CM | POA: Insufficient documentation

## 2014-06-08 DIAGNOSIS — Z8669 Personal history of other diseases of the nervous system and sense organs: Secondary | ICD-10-CM | POA: Diagnosis not present

## 2014-06-08 DIAGNOSIS — F419 Anxiety disorder, unspecified: Secondary | ICD-10-CM | POA: Diagnosis not present

## 2014-06-08 DIAGNOSIS — R1030 Lower abdominal pain, unspecified: Secondary | ICD-10-CM | POA: Diagnosis present

## 2014-06-08 HISTORY — DX: Calculus of kidney: N20.0

## 2014-06-08 LAB — URINALYSIS, ROUTINE W REFLEX MICROSCOPIC
Bilirubin Urine: NEGATIVE
GLUCOSE, UA: NEGATIVE mg/dL
Ketones, ur: 15 mg/dL — AB
LEUKOCYTES UA: NEGATIVE
NITRITE: NEGATIVE
Protein, ur: NEGATIVE mg/dL
SPECIFIC GRAVITY, URINE: 1.006 (ref 1.005–1.030)
Urobilinogen, UA: 0.2 mg/dL (ref 0.0–1.0)
pH: 6 (ref 5.0–8.0)

## 2014-06-08 LAB — BASIC METABOLIC PANEL
ANION GAP: 9 (ref 5–15)
BUN: 17 mg/dL (ref 6–23)
CO2: 26 mmol/L (ref 19–32)
CREATININE: 0.83 mg/dL (ref 0.50–1.10)
Calcium: 9.3 mg/dL (ref 8.4–10.5)
Chloride: 101 mmol/L (ref 96–112)
GFR calc Af Amer: >90 mL/min (ref >90)
GFR calc non Af Amer: 78 mL/min — ABNORMAL LOW (ref >90)
GLUCOSE: 100 mg/dL — AB (ref 70–99)
Potassium: 3.8 mmol/L (ref 3.5–5.1)
Sodium: 136 mmol/L (ref 135–145)

## 2014-06-08 LAB — URINE MICROSCOPIC-ADD ON

## 2014-06-08 MED ORDER — ONDANSETRON 4 MG PO TBDP: 4.0000 mg | ORAL_TABLET | Freq: Three times a day (TID) | ORAL | Status: DC | PRN

## 2014-06-08 MED ORDER — KETOROLAC TROMETHAMINE 30 MG/ML IJ SOLN
INTRAMUSCULAR | Status: AC
  Filled 2014-06-08: qty 1

## 2014-06-08 MED ORDER — OXYCODONE-ACETAMINOPHEN 5-325 MG PO TABS: 1.0000 | ORAL_TABLET | Freq: Four times a day (QID) | ORAL | Status: DC | PRN

## 2014-06-08 MED ORDER — KETOROLAC TROMETHAMINE 30 MG/ML IJ SOLN
30.0000 mg | Freq: Once | INTRAMUSCULAR | Status: AC
  Administered 2014-06-08: 30 mg via INTRAVENOUS

## 2014-06-08 MED ORDER — IBUPROFEN 600 MG PO TABS: 600.0000 mg | ORAL_TABLET | Freq: Four times a day (QID) | ORAL | Status: DC | PRN

## 2014-06-08 MED ORDER — SODIUM CHLORIDE 0.9 % IV BOLUS (SEPSIS)
1000.0000 mL | Freq: Once | INTRAVENOUS | Status: AC
  Administered 2014-06-08: 1000 mL via INTRAVENOUS

## 2014-06-08 MED ORDER — ONDANSETRON HCL 4 MG/2ML IJ SOLN
4.0000 mg | Freq: Once | INTRAMUSCULAR | Status: AC
  Administered 2014-06-08: 4 mg via INTRAVENOUS
  Filled 2014-06-08: qty 2

## 2014-06-08 MED ORDER — MORPHINE SULFATE 4 MG/ML IJ SOLN
4.0000 mg | Freq: Once | INTRAMUSCULAR | Status: AC
Start: 2014-06-08 — End: 2014-06-08
  Administered 2014-06-08: 4 mg via INTRAVENOUS
  Filled 2014-06-08: qty 1

## 2014-06-08 NOTE — ED Notes (Signed)
Per Reina Fuse, pt must have urine preg prior to CT per protocol

## 2014-06-08 NOTE — ED Notes (Signed)
Discussed with pt need for urine for urine preg prior to CT study-pt states she has gone thru menopause and husband had a vasectomy-"i am not pregnant"-EDP [place] notified-states she is comfortable with CT study and no preg test-Rad tech notified

## 2014-06-08 NOTE — ED Notes (Signed)
Left flank pain. Hx of kidney stones. Went to [facility]D this AM. Dx with UTI. Given RX

## 2014-06-08 NOTE — ED Notes (Signed)
Pt return from CT-requested ice chips-was notified of NPO status per EDP until after CT results

## 2014-06-08 NOTE — Discharge Instructions (Signed)
Acute Kidney Injury Acute kidney injury is a disease in which there is sudden (acute) damage to the kidneys. The kidneys are 2 organs that lie on either side of the spine between the middle of the back and the front of the abdomen. The kidneys:  Remove wastes and extra water from the blood.   Produce important hormones. These help keep bones strong, regulate blood pressure, and help create red blood cells.   Balance the fluids and chemicals in the blood and tissues. A small amount of kidney damage may not cause problems, but a large amount of damage may make it difficult or impossible for the kidneys to work the way they should. Acute kidney injury may develop into long-lasting (chronic) kidney disease. It may also develop into a life-threatening disease called end-stage kidney disease. Acute kidney injury can get worse very quickly, so it should be treated right away. Early treatment may prevent other kidney diseases from developing.  CAUSES   A problem with blood flow to the kidneys. This may be caused by:   Blood loss.   Heart disease.   Severe burns.   Liver disease.  Direct damage to the kidneys. This may be caused by:  Some medicines.   A kidney infection.   Poisoning or consuming toxic substances.   A surgical wound.   A blow to the kidney area.   A problem with urine flow. This may be caused by:   Cancer.   Kidney stones.   An enlarged prostate. SYMPTOMS   Swelling (edema) of the legs, ankles, or feet.   Tiredness (lethargy).   Nausea or vomiting.   Confusion.   Problems with urination, such as:   Painful or burning feeling during urination.   Decreased urine production.   Frequent accidents in children who are potty trained.   Bloody urine.   Muscle twitches and cramps.   Shortness of breath.   Seizures.   Chest pain or pressure. Sometimes, no symptoms are present. DIAGNOSIS Acute kidney injury may be detected  and diagnosed by tests, including blood, urine, imaging, or kidney biopsy tests.  TREATMENT Treatment of acute kidney injury varies depending on the cause and severity of the kidney damage. In mild cases, no treatment may be needed. The kidneys may heal on their own. If acute kidney injury is more severe, your caregiver will treat the cause of the kidney damage, help the kidneys heal, and prevent complications from occurring. Severe cases may require a procedure to remove toxic wastes from the body (dialysis) or surgery to repair kidney damage. Surgery may involve:   Repair of a torn kidney.   Removal of an obstruction. Most of the time, you will need to stay overnight at the hospital.  HOME CARE INSTRUCTIONS:  Follow your prescribed diet.  Only take over-the-counter or prescription medicines as directed by your caregiver.  Do not take any new medicines (prescription, over-the-counter, or nutritional supplements) unless approved by your caregiver. Many medicines can worsen your kidney damage or need to have the dose adjusted.   Keep all follow-up appointments as directed by your caregiver.  Observe your condition to make sure you are healing as expected. SEEK IMMEDIATE MEDICAL CARE IF:  You are feeling ill or have severe pain in the back or side.   Your symptoms return or you have new symptoms.  You have any symptoms of end-stage kidney disease. These include:   Persistent itchiness.   Loss of appetite.   Headaches.   Abnormally dark   or light skin.  Numbness in the hands or feet.   Easy bruising.   Frequent hiccups.   Menstruation stops.   You have a fever.  You have increased urine production.  You have pain or bleeding when urinating. MAKE SURE YOU:   Understand these instructions.  Will watch your condition.  Will get help right away if you are not doing well or get worse Document Released: 08/25/2010 Document Revised: 06/06/2012 Document  Reviewed: 10/09/2011 Exit[employer] Patient Information 2015 [employer]. This information is not intended to replace advice given to you by your health care provider. Make sure you discuss any questions you have with your health care provider.  

## 2014-06-08 NOTE — ED Notes (Signed)
Pt states she feels urge to void and up to BR with husband at side

## 2014-06-08 NOTE — ED Provider Notes (Signed)
CSN: [id number]     Arrival date & time 06/08/14  1450 History   First MD Initiated Contact with Patient 06/08/14 1510     Chief Complaint  Patient presents with  . Flank Pain     (Consider location/radiation/quality/duration/timing/severity/associated sxs/prior Treatment) HPI  This is a 54 year old female with a history of kidney stones who presents with left flank pain. She was seen by her primary care physician this morning and diagnosed with UTI. She reportedly had blood and protein in her urine. She has had progressively worsening left flank pain. History is taken from the patient's husband as she is very uncomfortable appearing and actively vomiting.  Reports to 10/10 pain.  Nonbilious, nonbloody emesis. No history of requiring lithotripsy in the past. Has not had a CT scan in "a long time."  Past Medical History  Diagnosis Date  . Depression     anxiety  . Insomnia   . SUI (stress urinary incontinence, female)   . Renal disorder   . Kidney stones    Past Surgical History  Procedure Laterality Date  . Laparoscopic appendectomy  2006  . Foot surgery  2005    right foot  . Urethral sling  2/11    Dr Matilde Sprang    Family History  Problem Relation Age of Onset  . Hypertension Mother   . Thyroid disease Mother   . Cancer Mother     lung  . Diabetes Paternal Grandfather   . Heart disease Father   . Heart disease Brother    History  Substance Use Topics  . Smoking status: Never Smoker   . Smokeless tobacco: Never Used  . Alcohol Use: 6.0 oz/week    12 Standard drinks or equivalent per week   OB History    Gravida Para Term Preterm AB TAB SAB Ectopic Multiple Living   3 2   1     2      Review of Systems  Constitutional: Negative for fever.  Respiratory: Negative for cough, chest tightness and shortness of breath.   Cardiovascular: Negative for chest pain.  Gastrointestinal: Positive for nausea and vomiting. Negative for abdominal pain and diarrhea.   Genitourinary: Positive for dysuria and flank pain.  Musculoskeletal: Negative for back pain.  Neurological: Negative for headaches.  All other systems reviewed and are negative.     Allergies  Sulfa antibiotics  Home Medications   Prior to Admission medications   Medication Sig Start Date End Date Taking? Authorizing Provider  desvenlafaxine (PRISTIQ) 100 MG 24 hr tablet 1 po q day 10/23/13   Megan Salon, MD  diphenhydramine-acetaminophen (TYLENOL PM) 25-500 MG TABS Take 1 tablet by mouth at bedtime as needed.    Historical Provider, MD  ibuprofen (ADVIL,MOTRIN) 600 MG tablet Take 1 tablet (600 mg total) by mouth every 6 (six) hours as needed. 06/08/14   Merryl Hacker, MD  Nutritional Supplements (JUICE PLUS FIBRE PO) Take by mouth.    Historical Provider, MD  ondansetron (ZOFRAN ODT) 4 MG disintegrating tablet Take 1 tablet (4 mg total) by mouth every 8 (eight) hours as needed for nausea or vomiting. 06/08/14   Merryl Hacker, MD  oxyCODONE-acetaminophen (PERCOCET/ROXICET) 5-325 MG per tablet Take 1-2 tablets by mouth every 6 (six) hours as needed for severe pain. 06/08/14   Merryl Hacker, MD  Pseudoeph-Doxylamine-DM-APAP (NYQUIL PO) Take by mouth as needed.    Historical Provider, MD   BP 138/80 mmHg  Pulse 93  Temp(Src) 98.2 F (36.8 C) (  Oral)  Resp 14  Ht 5\' 2"  (1.575 m)  Wt 160 lb (72.576 kg)  BMI 29.26 kg/m2  SpO2 99%  LMP 02/23/2009 Physical Exam  Constitutional: She is oriented to person, place, and time.  Uncomfortable appearing, actively vomiting  HENT:  Head: Normocephalic and atraumatic.  Cardiovascular: Normal rate, regular rhythm and normal heart sounds.   No murmur heard. Pulmonary/Chest: Effort normal and breath sounds normal. No respiratory distress. She has no wheezes.  Abdominal: Soft. Bowel sounds are normal. There is no tenderness. There is no rebound.  Genitourinary:  No CVA tenderness  Neurological: She is alert and oriented to person,  place, and time.  Skin: Skin is warm and dry.  Psychiatric: She has a normal mood and affect.  Nursing note and vitals reviewed.   ED Course  Procedures (including critical care time) Labs Review Labs Reviewed  URINALYSIS, ROUTINE W REFLEX MICROSCOPIC - Abnormal; Notable for the following:    Hgb urine dipstick MODERATE (*)    Ketones, ur 15 (*)    All other components within normal limits  BASIC METABOLIC PANEL - Abnormal; Notable for the following:    Glucose, Bld 100 (*)    GFR calc non Af Amer 78 (*)    All other components within normal limits  URINE MICROSCOPIC-ADD ON    Imaging Review Ct Renal Stone Study  06/08/2014   CLINICAL DATA:  Bilateral flank pain, 1 week duration, worsening today. Left worse than right. Visible hematuria.  EXAM: CT ABDOMEN AND PELVIS WITHOUT CONTRAST  TECHNIQUE: Multidetector CT imaging of the abdomen and pelvis was performed following the standard protocol without IV contrast.  COMPARISON:  None.  FINDINGS: Lung bases are clear. No pleural or pericardial fluid. The liver has a normal appearance without contrast. No calcified gallstones. The spleen is normal. The pancreas is normal. The adrenal glands are normal. The aorta and IVC are normal. The appendix is not inflamed. There may have been partial appendectomy. No fluid in the pelvis. Uterus and adnexal regions appear normal.  The left kidney contains dozens of stones ranging in size from 1 mm to 6 mm in diameter. There is moderate hydroureteronephrosis on the left with the ureter dilated all the way to the UVJ, where there is a 6 x 7 mm stone. No stone in the bladder. The right kidney contains multiple 1-4 mm stones. No passing stone on the right.  There is curvature of the spine.  IMPRESSION: Bilateral nephrolithiasis with multiple small stones in both kidneys. 6 x 7 mm stone in the left ureter at the UPJ with moderate left hydroureteronephrosis. No evidence of passing stone on the right.   Electronically  Signed   By: Nelson Chimes M.D.   On: 06/08/2014 16:04     EKG Interpretation None      MDM   Final diagnoses:  Left flank pain  Kidney stone    Patient presents with likely kidney stone. Uncomfortable appearing but nontoxic. Patient given pain and nausea medicine. CT scan shows a 6 x 7 mm stone at the UPJ. No superimposed urinary infection noted. Patient more comfortable on reexam. Discuss with patient expectant management including pain control and hydration. She will be given urology follow-up. She was told there is a chance she may not pass a stone this large. Patient and her husband stated understanding.  After history, exam, and medical workup I feel the patient has been appropriately medically screened and is safe for discharge home. Pertinent diagnoses were discussed  with the patient. Patient was given return precautions.     Merryl Hacker, MD 06/09/14 [phone]

## 2014-06-10 ENCOUNTER — Emergency Department (HOSPITAL_BASED_OUTPATIENT_CLINIC_OR_DEPARTMENT_OTHER)
Admission: EM | Admit: 2014-06-10 | Discharge: 2014-06-10 | Disposition: A | Discharge status: Other Acute Inpatient Hospital | Payer: 59 | Attending: Emergency Medicine | Admitting: Emergency Medicine

## 2014-06-10 ENCOUNTER — Encounter (HOSPITAL_BASED_OUTPATIENT_CLINIC_OR_DEPARTMENT_OTHER): Payer: Self-pay

## 2014-06-10 DIAGNOSIS — Z8669 Personal history of other diseases of the nervous system and sense organs: Secondary | ICD-10-CM | POA: Insufficient documentation

## 2014-06-10 DIAGNOSIS — F329 Major depressive disorder, single episode, unspecified: Secondary | ICD-10-CM | POA: Insufficient documentation

## 2014-06-10 DIAGNOSIS — R630 Anorexia: Secondary | ICD-10-CM | POA: Diagnosis not present

## 2014-06-10 DIAGNOSIS — F419 Anxiety disorder, unspecified: Secondary | ICD-10-CM | POA: Insufficient documentation

## 2014-06-10 DIAGNOSIS — N200 Calculus of kidney: Secondary | ICD-10-CM | POA: Insufficient documentation

## 2014-06-10 DIAGNOSIS — R109 Unspecified abdominal pain: Secondary | ICD-10-CM | POA: Diagnosis present

## 2014-06-10 LAB — BASIC METABOLIC PANEL
ANION GAP: 8 (ref 5–15)
BUN: 19 mg/dL (ref 6–23)
CO2: 27 mmol/L (ref 19–32)
Calcium: 9 mg/dL (ref 8.4–10.5)
Chloride: 102 mmol/L (ref 96–112)
Creatinine, Ser: 0.96 mg/dL (ref 0.50–1.10)
GFR calc Af Amer: 76 mL/min — ABNORMAL LOW (ref >90)
GFR calc non Af Amer: 65 mL/min — ABNORMAL LOW (ref >90)
Glucose, Bld: 142 mg/dL — ABNORMAL HIGH (ref 70–99)
POTASSIUM: 3.2 mmol/L — AB (ref 3.5–5.1)
Sodium: 137 mmol/L (ref 135–145)

## 2014-06-10 LAB — URINE MICROSCOPIC-ADD ON

## 2014-06-10 LAB — CBC WITH DIFFERENTIAL/PLATELET
BASOS ABS: 0 10*3/uL (ref 0.0–0.1)
BASOS PCT: 0 % (ref 0–1)
Eosinophils Absolute: 0.1 10*3/uL (ref 0.0–0.7)
Eosinophils Relative: 1 % (ref 0–5)
HCT: 42.9 % (ref 36.0–46.0)
Hemoglobin: 14.5 g/dL (ref 12.0–15.0)
LYMPHS ABS: 1.3 10*3/uL (ref 0.7–4.0)
Lymphocytes Relative: 12 % (ref 12–46)
MCH: 30.3 pg (ref 26.0–34.0)
MCHC: 33.8 g/dL (ref 30.0–36.0)
MCV: 89.6 fL (ref 78.0–100.0)
Monocytes Absolute: 0.8 10*3/uL (ref 0.1–1.0)
Monocytes Relative: 7 % (ref 3–12)
Neutro Abs: 8.6 10*3/uL — ABNORMAL HIGH (ref 1.7–7.7)
Neutrophils Relative %: 80 % — ABNORMAL HIGH (ref 43–77)
Platelets: 267 10*3/uL (ref 150–400)
RBC: 4.79 MIL/uL (ref 3.87–5.11)
RDW: 12.6 % (ref 11.5–15.5)
WBC: 10.8 10*3/uL — ABNORMAL HIGH (ref 4.0–10.5)

## 2014-06-10 LAB — URINALYSIS, ROUTINE W REFLEX MICROSCOPIC
Bilirubin Urine: NEGATIVE
Glucose, UA: NEGATIVE mg/dL
Ketones, ur: 40 mg/dL — AB
Leukocytes, UA: NEGATIVE
Nitrite: NEGATIVE
Protein, ur: NEGATIVE mg/dL
Specific Gravity, Urine: 1.017 (ref 1.005–1.030)
Urobilinogen, UA: 0.2 mg/dL (ref 0.0–1.0)
pH: 6 (ref 5.0–8.0)

## 2014-06-10 MED ORDER — SODIUM CHLORIDE 0.9 % IV BOLUS (SEPSIS)
1000.0000 mL | Freq: Once | INTRAVENOUS | Status: AC
Start: 2014-06-10 — End: 2014-06-10
  Administered 2014-06-10: 1000 mL via INTRAVENOUS

## 2014-06-10 MED ORDER — KETOROLAC TROMETHAMINE 30 MG/ML IJ SOLN
30.0000 mg | Freq: Once | INTRAMUSCULAR | Status: AC
  Administered 2014-06-10: 30 mg via INTRAVENOUS
  Filled 2014-06-10: qty 1

## 2014-06-10 MED ORDER — FENTANYL CITRATE (PF) 100 MCG/2ML IJ SOLN
50.0000 ug | Freq: Once | INTRAMUSCULAR | Status: AC
  Administered 2014-06-10: 50 ug via INTRAVENOUS
  Filled 2014-06-10: qty 2

## 2014-06-10 MED ORDER — METOCLOPRAMIDE HCL 5 MG/ML IJ SOLN
INTRAMUSCULAR | Status: AC
  Administered 2014-06-10: 10 mg
  Filled 2014-06-10: qty 2

## 2014-06-10 MED ORDER — METOCLOPRAMIDE HCL 5 MG/ML IJ SOLN
10.0000 mg | Freq: Once | INTRAMUSCULAR | Status: AC
  Administered 2014-06-10: 10 mg via INTRAVENOUS
  Filled 2014-06-10: qty 2

## 2014-06-10 MED ORDER — ONDANSETRON HCL 4 MG/2ML IJ SOLN
4.0000 mg | Freq: Once | INTRAMUSCULAR | Status: AC
  Administered 2014-06-10: 4 mg via INTRAVENOUS
  Filled 2014-06-10: qty 2

## 2014-06-10 MED ORDER — DIPHENHYDRAMINE HCL 50 MG/ML IJ SOLN
25.0000 mg | Freq: Once | INTRAMUSCULAR | Status: AC
  Administered 2014-06-10: 25 mg via INTRAVENOUS
  Filled 2014-06-10: qty 1

## 2014-06-10 MED ORDER — SODIUM CHLORIDE 0.9 % IV BOLUS (SEPSIS)
1000.0000 mL | Freq: Once | INTRAVENOUS | Status: AC
  Administered 2014-06-10: 1000 mL via INTRAVENOUS

## 2014-06-10 MED ORDER — HYDROMORPHONE HCL 1 MG/ML IJ SOLN
1.0000 mg | Freq: Once | INTRAMUSCULAR | Status: AC
  Administered 2014-06-10: 1 mg via INTRAVENOUS
  Filled 2014-06-10: qty 1

## 2014-06-10 NOTE — ED Notes (Signed)
Pt having an exacerbation of pain, writhing/ restless and vomiting.

## 2014-06-10 NOTE — ED Notes (Signed)
[facility] here to transport to [facility]. All belongings with spouse.

## 2014-06-10 NOTE — ED Notes (Signed)
Awaiting tx to [facility] by ground transport team

## 2014-06-10 NOTE — ED Notes (Signed)
Pain increasing, pending urine results for disposition, [facility] MD contacted by EDP.

## 2014-06-10 NOTE — ED Notes (Signed)
Resting/sleeping, NAD, calm, husband at [facility].

## 2014-06-10 NOTE — ED Provider Notes (Addendum)
CSN: [id number]     Arrival date & time 06/10/14  0306 History   First MD Initiated Contact with Patient 06/10/14 0309     Chief Complaint  Patient presents with  . Nephrolithiasis     (Consider location/radiation/quality/duration/timing/severity/associated sxs/prior Treatment) Patient is a 54 y.o. female presenting with abdominal pain.  Abdominal Pain Pain location:  L flank Pain quality: sharp   Pain radiates to:  Does not radiate Pain severity:  Severe Onset quality:  Gradual Duration:  4 days Timing:  Intermittent Progression:  Unchanged Chronicity:  Recurrent Context comment:  L nephrolithiasis at UPJ, 7 mm Relieved by:  Nothing Worsened by:  Movement and palpation Associated symptoms: anorexia, nausea and vomiting   Associated symptoms: no chills, no cough, no diarrhea and no fever     Past Medical History  Diagnosis Date  . Depression     anxiety  . Insomnia   . SUI (stress urinary incontinence, female)   . Renal disorder   . Kidney stones    Past Surgical History  Procedure Laterality Date  . Laparoscopic appendectomy  2006  . Foot surgery  2005    right foot  . Urethral sling  2/11    Dr Matilde Sprang    Family History  Problem Relation Age of Onset  . Hypertension Mother   . Thyroid disease Mother   . Cancer Mother     lung  . Diabetes Paternal Grandfather   . Heart disease Father   . Heart disease Brother    History  Substance Use Topics  . Smoking status: Never Smoker   . Smokeless tobacco: Never Used  . Alcohol Use: 6.0 oz/week    12 Standard drinks or equivalent per week   OB History    Gravida Para Term Preterm AB TAB SAB Ectopic Multiple Living   _0 Review of Systems  Constitutional: Negative for fever and chills.  Respiratory: Negative for cough.   Gastrointestinal: Positive for nausea, vomiting, abdominal pain and anorexia. Negative for diarrhea.  All other systems reviewed and are negative.     Allergies    Codeine; Hydrocodone; and Sulfa antibiotics  Home Medications   Prior to Admission medications   Medication Sig Start Date End Date Taking? Authorizing Provider  desvenlafaxine (PRISTIQ) 100 MG 24 hr tablet 1 po q day 10/23/13  Yes Megan Salon, MD  diphenhydramine-acetaminophen (TYLENOL PM) 25-500 MG TABS Take 1 tablet by mouth at bedtime as needed.   Yes Historical Provider, MD  ibuprofen (ADVIL,MOTRIN) 600 MG tablet Take 1 tablet (600 mg total) by mouth every 6 (six) hours as needed. 06/08/14  Yes Merryl Hacker, MD  Nutritional Supplements (JUICE PLUS FIBRE PO) Take by mouth.   Yes Historical Provider, MD  ondansetron (ZOFRAN ODT) 4 MG disintegrating tablet Take 1 tablet (4 mg total) by mouth every 8 (eight) hours as needed for nausea or vomiting. 06/08/14  Yes Merryl Hacker, MD  oxyCODONE-acetaminophen (PERCOCET/ROXICET) 5-325 MG per tablet Take 1-2 tablets by mouth every 6 (six) hours as needed for severe pain. 06/08/14  Yes Merryl Hacker, MD  Pseudoeph-Doxylamine-DM-APAP (NYQUIL PO) Take by mouth as needed.   Yes Historical Provider, MD   BP 114/65 mmHg  Pulse 85  SpO2 100%  LMP 02/23/2009 Physical Exam  Constitutional: She is oriented to person, place, and time. She appears well-developed and well-nourished.  HENT:  Head: Normocephalic and atraumatic.  Right Ear:  External ear normal.  Left Ear: External ear normal.  Eyes: Conjunctivae and EOM are normal. Pupils are equal, round, and reactive to light.  Neck: Normal range of motion. Neck supple.  Cardiovascular: Normal rate, regular rhythm, normal heart sounds and intact distal pulses.   Pulmonary/Chest: Effort normal and breath sounds normal.  Abdominal: Soft. Bowel sounds are normal. There is tenderness in the left upper quadrant. There is CVA tenderness (L).  Musculoskeletal: Normal range of motion.  Neurological: She is alert and oriented to person, place, and time.  Skin: Skin is warm and dry.  Vitals  reviewed.   ED Course  Procedures (including critical care time) Labs Review Labs Reviewed  CBC WITH DIFFERENTIAL/PLATELET - Abnormal; Notable for the following:    WBC 10.8 (*)    Neutrophils Relative % 80 (*)    Neutro Abs 8.6 (*)    All other components within normal limits  BASIC METABOLIC PANEL - Abnormal; Notable for the following:    Potassium 3.2 (*)    Glucose, Bld 142 (*)    GFR calc non Af Amer 65 (*)    GFR calc Af Amer 76 (*)    All other components within normal limits  URINALYSIS, ROUTINE W REFLEX MICROSCOPIC - Abnormal; Notable for the following:    Hgb urine dipstick SMALL (*)    Ketones, ur 40 (*)    All other components within normal limits  URINE CULTURE  URINE MICROSCOPIC-ADD ON    Imaging Review Ct Renal Stone Study  06/08/2014   CLINICAL DATA:  Bilateral flank pain, 1 week duration, worsening today. Left worse than right. Visible hematuria.  EXAM: CT ABDOMEN AND PELVIS WITHOUT CONTRAST  TECHNIQUE: Multidetector CT imaging of the abdomen and pelvis was performed following the standard protocol without IV contrast.  COMPARISON:  None.  FINDINGS: Lung bases are clear. No pleural or pericardial fluid. The liver has a normal appearance without contrast. No calcified gallstones. The spleen is normal. The pancreas is normal. The adrenal glands are normal. The aorta and IVC are normal. The appendix is not inflamed. There may have been partial appendectomy. No fluid in the pelvis. Uterus and adnexal regions appear normal.  The left kidney contains dozens of stones ranging in size from 1 mm to 6 mm in diameter. There is moderate hydroureteronephrosis on the left with the ureter dilated all the way to the UVJ, where there is a 6 x 7 mm stone. No stone in the bladder. The right kidney contains multiple 1-4 mm stones. No passing stone on the right.  There is curvature of the spine.  IMPRESSION: Bilateral nephrolithiasis with multiple small stones in both kidneys. 6 x 7 mm stone  in the left ureter at the UPJ with moderate left hydroureteronephrosis. No evidence of passing stone on the right.   Electronically Signed   By: Nelson Chimes M.D.   On: 06/08/2014 16:04     EKG Interpretation None      Urinalysis, Routine w reflex microscopic (Final result)Abnormal Component (Lab Inquiry)    Collection Time Result Time Color, Urine APPearance Specific Gravity, Urine pH GLUCOSE U   06/10/14 06:00:00 06/10/14 06:22:14 YELLOW CLEAR 1.017 6.0 NEGATIVE      Collection Time Result Time Hgb urine dipstick BILI UR Ketones, ur Protein, ur Urobilinogen, UA   06/10/14 06:00:00 06/10/14 06:22:14 SMALL (A) NEGATIVE 40 (A) NEGATIVE 0.2      Collection Time Result Time Nitrite LEUKOCYTES   06/10/14 06:00:00 06/10/14 06:22:14 NEGATIVE NEGATIVE  Urine culture (In process) Result time: 06/10/14 06:09:18      Urine microscopic-add on (Final result) Component (Lab Inquiry)    Collection Time Result Time Squamous Epithelial / LPF WBC U RBC / HPF Urine-Other   06/10/14 06:00:00 06/10/14 06:22:28 RARE 0-2 3-6 MUCOUS PRESENT             CBC with Differential (Final result)Abnormal Component (Lab Inquiry)    Collection Time Result Time WBC RBC HGB HCT MCV   06/10/14 03:39:00 06/10/14 03:51:01 10.8 (H) 4.79 14.5 42.9 89.6      Collection Time Result Time MCH MCHC RDW PLT NEUTRO PCT   06/10/14 03:39:00 06/10/14 03:51:01 30.3 33.8 12.6 267 80 (H)      Collection Time Result Time AB NEUTRO LYMPHO PCT AB LYM MONO PCT MONO ABS   06/10/14 03:39:00 06/10/14 03:51:01 8.6 (H) 12 1.3 7 0.8      Collection Time Result Time EOS PCT EOSINO ABS BASOS PCT BASOS ABS   06/10/14 03:39:00 06/10/14 03:51:01 1 0.1 0 0.0             Basic metabolic panel (Final result)Abnormal Component (Lab Inquiry)    Collection Time Result Time NA K CL CO2 GLUCOSE   06/10/14 03:39:00 06/10/14 04:03:37 137 3.2 (L) 102 27 142 (H)      Collection  Time Result Time BUN Creatinine, Ser CALCIUM GFR calc non Af Amer GFR calc Af Amer   06/10/14 03:39:00 06/10/14 04:03:37 19 0.96 9.0 65 (L)  76 (L)    (Comment)    (NOTE)  The eGFR has been calculated using the CKD EPI equation.  This calculation has not been validated in all clinical situations.  eGFR's persistently <90 mL/min signify possible Chronic Kidney  Disease.            Collection Time Result Time Anion gap   06/10/14 03:39:00 06/10/14 04:03:37 8       MDM   Final diagnoses:  Left nephrolithiasis    54 y.o. female with pertinent PMH of nephrolithiasis, including L 583m UPJ dx yesterday presents with continued pain and inability to tolerate PO.  Physical exam as above.  Pt given dilaudid 129m zofran 83m3mtoradol with minimal relief of symptoms. Also given 2L NS bolus.  Labs as above.  CT study from yesterday included in this note, not reobtained today.   I spoke with Dr. Hal[place]ada Crane [facility] with [facility] who accepted the pt for admission and would likely stent her given the size of the stone and refractory nature.  This was arranged and pt transferred uneventfully.  I have reviewed all laboratory and imaging studies if ordered as above  1. Left nephrolithiasis         MatDebby FreibergD 06/10/14 [id number]

## 2014-06-10 NOTE — ED Notes (Signed)
Urine requested, "not able at this time".

## 2014-06-10 NOTE — ED Notes (Signed)
PT presents actively vomiting with left flank pain - pt reports large renal stone, seen 3 days ago and dx with stone - states pain worse and stone not moving. Husband at bedside.

## 2014-06-10 NOTE — ED Notes (Signed)
2nd liter NS IVF hung.

## 2014-06-10 NOTE — ED Notes (Signed)
Rounding performed on pt, pt resting quietly w/ lights dimmed, comfort measures provided. Pt medicated per EDP latest orders. Husband at side. Awaiting ground transport team arrival

## 2014-06-10 NOTE — ED Notes (Signed)
Dr. Colin Rhein at [facility].

## 2014-06-11 LAB — URINE CULTURE
COLONY COUNT: NO GROWTH
Culture: NO GROWTH

## 2014-06-20 DIAGNOSIS — N211 Calculus in urethra: Secondary | ICD-10-CM | POA: Insufficient documentation

## 2014-06-20 DIAGNOSIS — N200 Calculus of kidney: Secondary | ICD-10-CM | POA: Insufficient documentation

## 2014-11-14 ENCOUNTER — Other Ambulatory Visit: Payer: Self-pay | Admitting: Obstetrics & Gynecology

## 2014-11-14 NOTE — Telephone Encounter (Signed)
Medication refill request: Pristiq  Last AEX:  10/23/13 SM Next AEX: 12/18/14 SM Last MMG (if hormonal medication request): 11/28/13 [id number]:neg Refill authorized: 10/23/13 #[id number]. Today please advise.

## 2014-12-18 ENCOUNTER — Encounter: Payer: Self-pay | Admitting: Obstetrics & Gynecology

## 2014-12-18 ENCOUNTER — Ambulatory Visit (INDEPENDENT_AMBULATORY_CARE_PROVIDER_SITE_OTHER): Payer: 59 | Admitting: Obstetrics & Gynecology

## 2014-12-18 VITALS — BP 136/82 | HR 64 | Resp 16 | Ht 63.0 in | Wt 175.0 lb

## 2014-12-18 DIAGNOSIS — Z124 Encounter for screening for malignant neoplasm of cervix: Secondary | ICD-10-CM | POA: Diagnosis not present

## 2014-12-18 DIAGNOSIS — Z0000 Encounter for general adult medical examination without abnormal findings: Secondary | ICD-10-CM

## 2014-12-18 DIAGNOSIS — Z01419 Encounter for gynecological examination (general) (routine) without abnormal findings: Secondary | ICD-10-CM | POA: Diagnosis not present

## 2014-12-18 LAB — POCT URINALYSIS DIPSTICK
Bilirubin, UA: NEGATIVE
Blood, UA: NEGATIVE
Glucose, UA: NEGATIVE
KETONES UA: NEGATIVE
NITRITE UA: NEGATIVE
PH UA: 7
Protein, UA: NEGATIVE
Urobilinogen, UA: NEGATIVE

## 2014-12-18 MED ORDER — ESTROGENS, CONJUGATED 0.625 MG/GM VA CREA: TOPICAL_CREAM | VAGINAL | Status: DC

## 2014-12-18 MED ORDER — TRIAMCINOLONE ACETONIDE 0.1 % EX CREA: 1.0000 "application " | TOPICAL_CREAM | Freq: Two times a day (BID) | CUTANEOUS | Status: DC

## 2014-12-18 MED ORDER — VENLAFAXINE HCL ER 75 MG PO CP24: 75.0000 mg | ORAL_CAPSULE | Freq: Every day | ORAL | Status: DC

## 2014-12-18 NOTE — Progress Notes (Signed)
54 y.o. [id number] MarriedCaucasianF here for annual exam.  Prestiq was not covered with prior authorization.  Will do a month trial of Effexor XR and she will be call me back with a report in about a month.  Had issue with kidney stones in April.  Had to have lithotripsy.  Pt reports her studies showed she has a lot of stones.  Also on potassium supplement due to stone analysis.  Pt reports she was to have follow-up.  Hasn't gone yet.    Denies vaginal bleeding.    PCP:  Dr. Leafy Ro.  Blood work done 02/13/14.  reviewed in [place].    Patient's last menstrual period was 02/23/2009.          Sexually active: Yes.    The current method of family planning is vasectomy.    Exercising: Yes.    walking and wieghts-some Smoker:  no  Health Maintenance: Pap:  10/23/13 WNL/negative HR HPV History of abnormal Pap:  no MMG:  11/28/13 BiRads 1-negative Colonoscopy:  3/12-repeat in 10 years BMD:   Heel test--years ago TDaP:  10/23/13 Screening Labs: PCP, Hb today: PCP, Urine today: WBC-1+, PH-7.0   reports that she has never smoked. She has never used smokeless tobacco. She reports that she drinks about 4.8 oz of alcohol per week. She reports that she does not use illicit drugs.  Past Medical History  Diagnosis Date  . Depression     anxiety  . Insomnia   . SUI (stress urinary incontinence, female)   . Renal disorder   . Kidney stones     multiple-4/16    Past Surgical History  Procedure Laterality Date  . Laparoscopic appendectomy  2006  . Foot surgery  2005    right foot  . Urethral sling  2/11    Dr Matilde Sprang   . Lithotripsy  4/16    removed renal stones    Current Outpatient Prescriptions  Medication Sig Dispense Refill  . ibuprofen (ADVIL,MOTRIN) 600 MG tablet Take 1 tablet (600 mg total) by mouth every 6 (six) hours as needed. 30 tablet 0  . meloxicam (MOBIC) 7.5 MG tablet Take 7.5 mg by mouth. As needed    . Nutritional Supplements (JUICE PLUS FIBRE PO)  Take by mouth.    . Potassium Citrate 15 MEQ (1620 MG) TBCR Take 1 tablet by mouth 2 (two) times daily with a meal.  12  . PRISTIQ 100 MG 24 hr tablet TAKE 1 TABLET BY MOUTH DAILY 30 tablet 1  . diphenhydramine-acetaminophen (TYLENOL PM) 25-500 MG TABS Take 1 tablet by mouth at bedtime as needed.    . Pseudoeph-Doxylamine-DM-APAP (NYQUIL PO) Take by mouth as needed.     No current facility-administered medications for this visit.    Family History  Problem Relation Age of Onset  . Hypertension Mother   . Thyroid disease Mother   . Cancer Mother     lung  . Diabetes Paternal Grandfather   . Heart disease Father   . Heart disease Brother     ROS:  Pertinent items are noted in HPI.  Otherwise, a comprehensive ROS was negative.  Exam:   BP 136/82 mmHg  Pulse 64  Resp 16  Ht 5\' 3"  (1.6 m)  Wt 175 lb (79.379 kg)  BMI 31.01 kg/m2  LMP 02/23/2009  Weight change: -2#   Height: 5\' 3"  (160 cm)  Ht Readings from Last 3 Encounters:  12/18/14 5\' 3"  (1.6 m)  06/08/14 5\' 2"  (1.575 m)  10/23/13 5' 2.5" (1.588 m)    General appearance: alert, cooperative and appears stated age Head: Normocephalic, without obvious abnormality, atraumatic Neck: no adenopathy, supple, symmetrical, trachea midline and thyroid normal to inspection and palpation Lungs: clear to auscultation bilaterally Breasts: normal appearance, no masses or tenderness Heart: regular rate and rhythm Abdomen: soft, non-tender; bowel sounds normal; no masses,  no organomegaly Extremities: extremities normal, atraumatic, no cyanosis or edema Skin: Skin color, texture, turgor normal. Erythematous rash, raised right mid forearm Lymph nodes: Cervical, supraclavicular, and axillary nodes normal. No abnormal inguinal nodes palpated Neurologic: Grossly normal   Pelvic: External genitalia:  22mm purplish lesion on right vulvar, inferior to clitoris              Urethra:  normal appearing urethra with no masses, tenderness or  lesions              Bartholins and Skenes: normal                 Vagina: normal appearing vagina with normal color and discharge, no lesions              Cervix: no lesions              Pap taken: No. Bimanual Exam:  Uterus:  normal size, contour, position, consistency, mobility, non-tender              Adnexa: normal adnexa and no mass, fullness, tenderness               Rectovaginal: Confirms               Anus:  normal sphincter tone, no lesions  Chaperone was present for exam.  A:  Well Woman with normal exam  Strong family hx of CVD, saw Dr. Mare Ferrari 2013  PMP, no HRT  OAB after mid urethral sling placement--not on medication  H/O depression/anxiety Recent diagnosis of nephrolithiasis Small endocervical polyp Purplish lesion on right vulva inferior to clitoris  Right arm skin rash  P: Mammogram yearly pap smear 2012 neg with neg HR HPV. Pap neg 2015.  Pap obtained today. Effexor XR 75mg  daily.  Pt will try this and see if she has any symptoms.  Insurance is requiring that she fail this before approving Prestiq. Labs with PCP in December Pt will use topical premarin cream for two weeks and return for follow up.  May need biopsy of vulvar lesion Kenalog rx to pharmacy. return annually or prn

## 2014-12-19 ENCOUNTER — Telehealth: Payer: Self-pay | Admitting: Emergency Medicine

## 2014-12-19 LAB — IPS PAP TEST WITH REFLEX TO HPV

## 2014-12-19 NOTE — Telephone Encounter (Signed)
Encounter opened erroneously.   Closed encounter.   

## 2015-01-08 ENCOUNTER — Ambulatory Visit (INDEPENDENT_AMBULATORY_CARE_PROVIDER_SITE_OTHER): Payer: 59 | Admitting: Obstetrics & Gynecology

## 2015-01-08 VITALS — BP 116/68 | HR 72 | Resp 16 | Wt 176.0 lb

## 2015-01-08 DIAGNOSIS — F411 Generalized anxiety disorder: Secondary | ICD-10-CM | POA: Diagnosis not present

## 2015-01-08 DIAGNOSIS — F32A Depression, unspecified: Secondary | ICD-10-CM

## 2015-01-08 DIAGNOSIS — L900 Lichen sclerosus et atrophicus: Secondary | ICD-10-CM | POA: Diagnosis not present

## 2015-01-08 DIAGNOSIS — F329 Major depressive disorder, single episode, unspecified: Secondary | ICD-10-CM | POA: Diagnosis not present

## 2015-01-08 DIAGNOSIS — N9089 Other specified noninflammatory disorders of vulva and perineum: Secondary | ICD-10-CM

## 2015-01-10 MED ORDER — DESVENLAFAXINE SUCCINATE ER 100 MG PO TB24: 100.0000 mg | ORAL_TABLET | Freq: Every day | ORAL | Status: DC

## 2015-01-13 ENCOUNTER — Encounter: Payer: Self-pay | Admitting: Obstetrics & Gynecology

## 2015-01-13 DIAGNOSIS — F329 Major depressive disorder, single episode, unspecified: Secondary | ICD-10-CM | POA: Insufficient documentation

## 2015-01-13 DIAGNOSIS — F411 Generalized anxiety disorder: Secondary | ICD-10-CM | POA: Insufficient documentation

## 2015-01-13 DIAGNOSIS — F32A Depression, unspecified: Secondary | ICD-10-CM | POA: Insufficient documentation

## 2015-01-13 DIAGNOSIS — L900 Lichen sclerosus et atrophicus: Secondary | ICD-10-CM | POA: Insufficient documentation

## 2015-01-13 NOTE — Progress Notes (Signed)
Subjective:     Patient ID: Joy Petty, female   DOB: March 08, 1960, 54 y.o.   MRN: [id number]  HPI 54 yo G3P2MWF here for recheck of purplish vulvar lesion noted about a month ago.  Pt reports she didn't use the estrogen very much so if there are any changes, it is probably not due to using the estrogen cream.  Denies vulvar itching.  Denies pain.  No vaginal bleeding.  Pt's insurance require that she failed Effexor before using Prestiq.  Has done really well with her Prestiq.  Does not feel the Effexor is working the same and does not feel the same.  Feels like her side effects are more than with the Prestiq.  Will try to precert the Prestiq now.    Review of Systems  All other systems reviewed and are negative.      Objective:   Physical Exam  Constitutional: She is oriented to person, place, and time. She appears well-developed and well-nourished.  Genitourinary: Vagina normal.    There is lesion on the right labia. There is no rash, tenderness or injury on the right labia. There is no rash, tenderness, lesion or injury on the left labia.  Hypopigmentation in symmetric pattern in region between labia majora and minora which is visually consistent with lichen sclerosus.    Neurological: She is alert and oriented to person, place, and time.  Skin: Skin is warm and dry.  Psychiatric: She has a normal mood and affect.   Vulvar biopsy recommended. Consent obtained.  Area cleansed with betadine x 3.  0.5cc 1% Lidocaine plain instilled.  56mm punch biopsy obtained under sterile technique.  Silver nitrate used for hemostasis.  Pt tolerated procedure well.  No dressing applied.     Assessment:     Purplish vulvar lesion in region where visually it looks like lichen sclerosus is present Trial and failure of Effexor during last month     Plan:     Biopsy to pathology.  Results and recommendations will be made when results are finalized. Rx for Prestiq again to pharmacy.

## 2015-03-19 ENCOUNTER — Telehealth: Payer: Self-pay | Admitting: Emergency Medicine

## 2015-03-19 ENCOUNTER — Encounter: Payer: Self-pay | Admitting: Obstetrics & Gynecology

## 2015-03-19 NOTE — Telephone Encounter (Signed)
Chief Complaint  Patient presents with  . Medication Management    Patient request medication change     ===View-only below this line===   ----- Message -----    From: [facility]    Sent: 03/19/2015  3:35 PM EST      To: Lyman Speller, MD Subject: Non-Urgent Medical Question  Hi!    Dr Sabra Heck prescribed Venlafaxine ER to me on my last visit instead of the Pristiq because my insurance would not cover it.  I have found this new medication to cause headaches and would like to go back to the Pristiq. My new pharmacy is [facility] [phone];  ph:  [phone]  ext. 0 Please advise on this ASAP.  Thank you so much!     Joy Petty [phone]

## 2015-03-19 NOTE — Telephone Encounter (Signed)
Telephone call for triage created to discuss message with patient and disposition as appropriate.   

## 2015-12-16 ENCOUNTER — Encounter: Payer: Self-pay | Admitting: Obstetrics & Gynecology

## 2016-02-21 ENCOUNTER — Other Ambulatory Visit: Payer: Self-pay | Admitting: Obstetrics & Gynecology

## 2016-02-21 NOTE — Telephone Encounter (Signed)
Medication refill request: Pristiq 100mg  Last AEX:  12/18/14 SM Next AEX: 04/07/16  Last MMG (if hormonal medication request): 11/28/13 BIRADS 1 negative Refill authorized: 01/10/15 #30 w/12 refills; today please advise

## 2016-04-07 ENCOUNTER — Ambulatory Visit (INDEPENDENT_AMBULATORY_CARE_PROVIDER_SITE_OTHER): Payer: Commercial Managed Care - HMO | Admitting: Obstetrics & Gynecology

## 2016-04-07 ENCOUNTER — Other Ambulatory Visit: Payer: Self-pay | Admitting: Obstetrics & Gynecology

## 2016-04-07 ENCOUNTER — Encounter: Payer: Self-pay | Admitting: Obstetrics & Gynecology

## 2016-04-07 VITALS — BP 140/90 | HR 76 | Resp 16 | Ht 62.5 in | Wt 179.6 lb

## 2016-04-07 DIAGNOSIS — Z1231 Encounter for screening mammogram for malignant neoplasm of breast: Secondary | ICD-10-CM

## 2016-04-07 DIAGNOSIS — Z0000 Encounter for general adult medical examination without abnormal findings: Secondary | ICD-10-CM

## 2016-04-07 DIAGNOSIS — Z01419 Encounter for gynecological examination (general) (routine) without abnormal findings: Secondary | ICD-10-CM

## 2016-04-07 DIAGNOSIS — Z205 Contact with and (suspected) exposure to viral hepatitis: Secondary | ICD-10-CM | POA: Diagnosis not present

## 2016-04-07 LAB — LIPID PANEL
CHOL/HDL RATIO: 3.6 ratio (ref <5.0)
CHOLESTEROL: 232 mg/dL — AB (ref <200)
HDL: 65 mg/dL (ref >50)
LDL Cholesterol: 127 mg/dL — ABNORMAL HIGH (ref <100)
Triglycerides: 201 mg/dL — ABNORMAL HIGH (ref <150)
VLDL: 40 mg/dL — AB (ref <30)

## 2016-04-07 LAB — CBC
HCT: 43 % (ref 35.0–45.0)
Hemoglobin: 14.6 g/dL (ref 11.7–15.5)
MCH: 30.4 pg (ref 27.0–33.0)
MCHC: 34 g/dL (ref 32.0–36.0)
MCV: 89.6 fL (ref 80.0–100.0)
MPV: 9.9 fL (ref 7.5–12.5)
PLATELETS: 273 10*3/uL (ref 140–400)
RBC: 4.8 MIL/uL (ref 3.80–5.10)
RDW: 13.3 % (ref 11.0–15.0)
WBC: 5.5 10*3/uL (ref 3.8–10.8)

## 2016-04-07 MED ORDER — PRISTIQ 100 MG PO TB24: 100.0000 mg | ORAL_TABLET | Freq: Every day | ORAL | 12 refills | Status: DC

## 2016-04-07 MED ORDER — NYSTATIN 100000 UNIT/GM EX CREA: 1.0000 "application " | TOPICAL_CREAM | Freq: Two times a day (BID) | CUTANEOUS | 0 refills | Status: DC

## 2016-04-07 NOTE — Progress Notes (Signed)
56 y.o. [id number] MarriedCaucasianF here for annual exam.  Doing well.  Still having some issues with stones.  Sees Dr. [place] Crane in [place].   Denies vaginal bleeding.     Patient's last menstrual period was 02/23/2009.          Sexually active: Yes.    The current method of family planning is vasectomy.    Exercising: No.  The patient does not participate in regular exercise at present. Smoker:  no  Health Maintenance: Pap:  12/18/14 negative, 10/23/13 negative, HR HPV negative  History of abnormal Pap:  no MMG:  11/28/13 BIRADS 1 negative  Colonoscopy:  3/12- repeat 10 years  BMD:   Years ago TDaP:  10/23/13  Pneumonia vaccine(s):  Never Zostavax:   Never Hep C testing: doing today Screening Labs: will obtain at the end of the visit   reports that she has never smoked. She has never used smokeless tobacco. She reports that she drinks about 4.8 oz of alcohol per week . She reports that she does not use drugs.  Past Medical History:  Diagnosis Date  . Depression    anxiety  . Insomnia   . Kidney stones    multiple-4/16  . Renal disorder   . SUI (stress urinary incontinence, female)     Past Surgical History:  Procedure Laterality Date  . CYSTOSCOPY/RETROGRADE/URETEROSCOPY/STONE EXTRACTION WITH BASKET  4/16   Dr. Jonette Eva  . FOOT SURGERY  2005   right foot  . LAPAROSCOPIC APPENDECTOMY  2006  . LITHOTRIPSY  4/16   Dr. Jonette Eva  . URETHRAL SLING  2/11   Dr Matilde Sprang     Current Outpatient Prescriptions  Medication Sig Dispense Refill  . diphenhydramine-acetaminophen (TYLENOL PM) 25-500 MG TABS Take 1 tablet by mouth at bedtime as needed.    Marland Kitchen ibuprofen (ADVIL,MOTRIN) 600 MG tablet Take 1 tablet (600 mg total) by mouth every 6 (six) hours as needed. 30 tablet 0  . meloxicam (MOBIC) 7.5 MG tablet Take 7.5 mg by mouth.    . Nutritional Supplements (JUICE PLUS FIBRE PO) Take by mouth.    . Potassium Citrate 15 MEQ (1620 MG) TBCR Take 1 tablet by mouth 2 (two) times daily with a  meal.  12  . PRISTIQ 100 MG 24 hr tablet TAKE ONE TABLET BY MOUTH ONCE DAILY 30 tablet 2  . Pseudoeph-Doxylamine-DM-APAP (NYQUIL PO) Take by mouth as needed.     No current facility-administered medications for this visit.     Family History  Problem Relation Age of Onset  . Hypertension Mother   . Thyroid disease Mother   . Cancer Mother     lung  . Diabetes Paternal Grandfather   . Heart disease Father   . Heart disease Brother     ROS:  Pertinent items are noted in HPI.  Otherwise, a comprehensive ROS was negative.  Exam:   BP 140/90 (BP Location: Right Arm, Patient Position: Sitting, Cuff Size: Normal)   Pulse 76   Resp 16   Ht 5' 2.5" (1.588 m)   Wt 179 lb 9.6 oz (81.5 kg)   LMP 02/23/2009   BMI 32.33 kg/m   Weight change: +4#  Height: 5' 2.5" (158.8 cm)  Ht Readings from Last 3 Encounters:  04/07/16 5' 2.5" (1.588 m)  12/18/14 5\' 3"  (1.6 m)  06/08/14 5\' 2"  (1.575 m)   General appearance: alert, cooperative and appears stated age Head: Normocephalic, without obvious abnormality, atraumatic Neck: no adenopathy, supple, symmetrical, trachea midline  and thyroid normal to inspection and palpation Lungs: clear to auscultation bilaterally Breasts: normal appearance, no masses or tenderness, mild erythema beneath right breast c/w yeast Heart: regular rate and rhythm Abdomen: soft, non-tender; bowel sounds normal; no masses,  no organomegaly Extremities: extremities normal, atraumatic, no cyanosis or edema Skin: Skin color, texture, turgor normal. No rashes or lesions Lymph nodes: Cervical, supraclavicular, and axillary nodes normal. No abnormal inguinal nodes palpated Neurologic: Grossly normal   Pelvic: External genitalia:  no lesions              Urethra:  normal appearing urethra with no masses, tenderness or lesions              Bartholins and Skenes: normal                 Vagina: normal appearing vagina with normal color and discharge, no lesions               Cervix: no lesions              Pap taken: No. Bimanual Exam:  Uterus:  normal size, contour, position, consistency, mobility, non-tender              Adnexa: normal adnexa and no mass, fullness, tenderness               Rectovaginal: Confirms               Anus:  normal sphincter tone, no lesions  Physical Exam  Genitourinary:        Chaperone was present for exam.  A:  Well Woman with normal exam Strong family hx of CVD, saw Dr. Mare Ferrari 2013 PMP, no HRT Biopsy proven Lichen sclerosus H/O Depression/anxiety OAB after mid urethral sling placement--not on medication Skin yeast underneath breast  P:   Mammogram yearly pap smear not obtained today.  Pap with neg HR HPV obtained today. CBC, Vit D, Lipids, TSH Hep C antibody will be obtained Rx for Prestiq 100mg  daily.  Brand Only.  #30/12RF Rx for nystatin cream topically bid for up to 7 days.  #30 grams/0RF. return annually or prn

## 2016-04-08 LAB — HEPATITIS C ANTIBODY: HCV AB: NEGATIVE

## 2016-04-08 LAB — VITAMIN D 25 HYDROXY (VIT D DEFICIENCY, FRACTURES): VIT D 25 HYDROXY: 26 ng/mL — AB (ref 30–100)

## 2016-04-08 LAB — TSH: TSH: 2.49 m[IU]/L

## 2016-04-29 ENCOUNTER — Ambulatory Visit: Payer: Self-pay

## 2016-05-20 ENCOUNTER — Ambulatory Visit
Admission: RE | Admit: 2016-05-20 | Discharge: 2016-05-20 | Disposition: A | Discharge status: Home / Self Care | Payer: Commercial Managed Care - HMO | Source: Ambulatory Visit | Attending: Obstetrics & Gynecology | Admitting: Obstetrics & Gynecology

## 2016-05-20 DIAGNOSIS — Z1231 Encounter for screening mammogram for malignant neoplasm of breast: Secondary | ICD-10-CM

## 2016-05-29 ENCOUNTER — Telehealth: Payer: Self-pay | Admitting: Obstetrics & Gynecology

## 2016-05-29 NOTE — Telephone Encounter (Signed)
Have you received a request for PA on this patient?

## 2016-05-29 NOTE — Telephone Encounter (Signed)
Patient is calling to get prior authorization for Pristiq. She is using  [facility] at [phone]

## 2016-06-01 NOTE — Telephone Encounter (Signed)
I have not received PA for this patient.  I have completed PA via Cover My Meds as requested by patient. Request Reference Number: [id number]. PRISTIQ TAB 100MG  is approved through 06/01/2017. For further questions, call [phone].  Left detailed message at number provided [phone], okay per ROI. Advised PA has been approved and she will be able to fill rx for Pristiq at this time. Advised to return call with any questions or concerns.  Cc: Terence Lux, CMA  Routing to provider for final review. Patient agreeable to disposition. Will close encounter.

## 2017-02-23 HISTORY — PX: FOOT FRACTURE SURGERY: SHX645

## 2017-07-09 ENCOUNTER — Ambulatory Visit: Payer: Commercial Managed Care - HMO | Admitting: Obstetrics & Gynecology

## 2017-10-06 ENCOUNTER — Emergency Department (HOSPITAL_BASED_OUTPATIENT_CLINIC_OR_DEPARTMENT_OTHER)
Admission: EM | Admit: 2017-10-06 | Discharge: 2017-10-06 | Disposition: A | Discharge status: Home / Self Care | Payer: 59 | Attending: Emergency Medicine | Admitting: Emergency Medicine

## 2017-10-06 ENCOUNTER — Emergency Department (HOSPITAL_BASED_OUTPATIENT_CLINIC_OR_DEPARTMENT_OTHER): Payer: 59

## 2017-10-06 ENCOUNTER — Encounter (HOSPITAL_BASED_OUTPATIENT_CLINIC_OR_DEPARTMENT_OTHER): Payer: Self-pay

## 2017-10-06 ENCOUNTER — Other Ambulatory Visit: Payer: Self-pay

## 2017-10-06 DIAGNOSIS — M79671 Pain in right foot: Secondary | ICD-10-CM | POA: Diagnosis not present

## 2017-10-06 NOTE — Discharge Instructions (Addendum)
You were evaluated in the Emergency Department and after careful evaluation, we did not find any emergent condition requiring admission or further testing in the hospital.  Your symptoms today seem to be due to strain or sprain of the soft tissues of the foot.  Please use Tylenol or ibuprofen as needed for pain at home.  If pain is not significantly improved in 2 weeks please consider following up with your primary care provider or surgeon.  Please return to the Emergency Department if you experience any worsening of your condition.  We encourage you to follow up with a primary care provider.  Thank you for allowing [place] to be a part of your care.

## 2017-10-06 NOTE — ED Notes (Signed)
Patient to xray via WC

## 2017-10-06 NOTE — ED Provider Notes (Signed)
[facility] Emergency Department Provider Note MRN:  [id number]  Arrival date & time: 10/06/17     Chief Complaint   Foot Pain   History of Present Illness   Joy Petty is a 57 y.o. year-old female presenting to the ED with chief complaint of foot pain.  Shortly prior to arrival, patient slipped on some wet flooring.  Since that time endorsing sudden onset right foot pain.  Pain located in the anterior part of the foot.  Denies ankle pain, fell softly onto her buttocks, no head trauma, no loss consciousness, no anticoagulation.  Endorsing isolated right foot pain that is moderate in severity, worse with motion or ambulation.  Review of Systems  A problem-focused ROS was performed. Positive for foot pain.  Patient denies ankle pain, no head trauma.    Patient's Health History    Past Medical History:  Diagnosis Date  . Depression    anxiety  . Insomnia   . Kidney stones    multiple-4/16  . SUI (stress urinary incontinence, female)     Past Surgical History:  Procedure Laterality Date  . CYSTOSCOPY/RETROGRADE/URETEROSCOPY/STONE EXTRACTION WITH BASKET  4/16   Dr. Jonette Eva  . FOOT SURGERY  2005   right foot  . LAPAROSCOPIC APPENDECTOMY  2006  . LITHOTRIPSY  4/16   Dr. Jonette Eva  . URETHRAL SLING  2/11   Dr Matilde Sprang     Family History  Problem Relation Age of Onset  . Hypertension Mother   . Thyroid disease Mother   . Cancer Mother        lung  . Diabetes Paternal Grandfather   . Heart disease Father   . Heart disease Brother     Social History   Socioeconomic History  . Marital status: Married    Spouse name: Not on file  . Number of children: Not on file  . Years of education: Not on file  . Highest education level: Not on file  Occupational History  . Not on file  Social Needs  . Financial resource strain: Not on file  . Food insecurity:    Worry: Not on file    Inability: Not on file  . Transportation  needs:    Medical: Not on file    Non-medical: Not on file  Tobacco Use  . Smoking status: Never Smoker  . Smokeless tobacco: Never Used  Substance and Sexual Activity  . Alcohol use: Yes    Comment: occ  . Drug use: No  . Sexual activity: Not on file    Comment: vasectomy  Lifestyle  . Physical activity:    Days per week: Not on file    Minutes per session: Not on file  . Stress: Not on file  Relationships  . Social connections:    Talks on phone: Not on file    Gets together: Not on file    Attends religious service: Not on file    Active member of club or organization: Not on file    Attends meetings of clubs or organizations: Not on file    Relationship status: Not on file  . Intimate partner violence:    Fear of current or ex partner: Not on file    Emotionally abused: Not on file    Physically abused: Not on file    Forced sexual activity: Not on file  Other Topics Concern  . Not on file  Social History Narrative  . Not on file  Physical Exam  Vital Signs and Nursing Notes reviewed Vitals:   10/06/17 1154  BP: (!) 156/83  Pulse: 86  Resp: 18  Temp: 98.4 F (36.9 C)  SpO2: 98%    CONSTITUTIONAL: Well-appearing, NAD NEURO:  Alert and oriented x 3, no focal deficits EYES:  eyes equal and reactive ENT/NECK:  no LAD, no JVD CARDIO: Regular rate, well-perfused, normal S1 and S2 PULM:  CTAB no wheezing or rhonchi GI/GU:  normal bowel sounds, non-distended, non-tender MSK/SPINE:  No gross deformities, mild tenderness and swelling to the anterior right midfoot, neurovascularly intact SKIN:  no rash, atraumatic PSYCH:  Appropriate speech and behavior  Diagnostic and Interventional Summary    EKG Interpretation  Date/Time:    Ventricular Rate:    PR Interval:    QRS Duration:   QT Interval:    QTC Calculation:   R Axis:     Text Interpretation:        Labs Reviewed - No data to display  DG Foot Complete Right    (Results Pending)      Medications - No data to display   Procedures Critical Care  ED Course and Medical Decision Making  I have reviewed the triage vital signs and the nursing notes.  Pertinent labs & imaging results that were available during my care of the patient were reviewed by me and considered in my medical decision making (see below for details).    57 year old female here with mechanical fall, isolated right foot pain.  No tenderness to the malleoli, good range of motion of the ankle.  Swelling and erythema of the dorsal surface of the foot seems to be related to friction from the flip-flop with the patient was wearing.  No significant inversion or eversion, no significant plantar surface trauma.  Low concern for fracture but will x-ray.    X-ray unremarkable, patient will follow-up with her regular provider if symptoms not significantly improved in 2 weeks.  After the discussed management above, the patient was determined to be safe for discharge.  The patient was in agreement with this plan and all questions regarding their care were answered.  ED return precautions were discussed and the patient will return to the ED with any significant worsening of condition.   Barth Kirks. Sedonia Small, [place] [email or web address]  Final Clinical Impressions(s) / ED Diagnoses  No diagnosis found.  ED Discharge Orders    None         Maudie Flakes, MD 10/06/17 1250

## 2017-10-06 NOTE — ED Triage Notes (Signed)
Pt states she slipped this am injured top of right foot-to triage in w/c-NAD

## 2017-11-22 ENCOUNTER — Encounter: Payer: Self-pay | Admitting: Obstetrics & Gynecology

## 2017-11-22 ENCOUNTER — Ambulatory Visit: Payer: 59 | Admitting: Obstetrics & Gynecology

## 2017-11-22 DIAGNOSIS — Z01419 Encounter for gynecological examination (general) (routine) without abnormal findings: Secondary | ICD-10-CM

## 2017-11-22 MED ORDER — DESVENLAFAXINE SUCCINATE ER 100 MG PO TB24: 100.0000 mg | ORAL_TABLET | Freq: Every day | ORAL | 0 refills | Status: DC

## 2017-11-22 NOTE — Progress Notes (Signed)
Pt here for AEX.  She is wearing a boot and I do not feel she can adequately position for her exam today.  Prestiq refilled.  She will return in about a month for AEX.

## 2017-11-29 ENCOUNTER — Telehealth: Payer: Self-pay | Admitting: Obstetrics & Gynecology

## 2017-11-29 ENCOUNTER — Other Ambulatory Visit: Payer: Self-pay | Admitting: Obstetrics & Gynecology

## 2017-11-29 MED ORDER — DESVENLAFAXINE SUCCINATE ER 100 MG PO TB24: 100.0000 mg | ORAL_TABLET | Freq: Every day | ORAL | 0 refills | Status: DC

## 2017-11-29 NOTE — Telephone Encounter (Signed)
Dr. Sabra Heck, okay to place order for generic Pristiq 100 mg for 30 day supply per patient request to try generic?

## 2017-11-29 NOTE — Telephone Encounter (Signed)
30 day supply of generic Prestiq sent to [facility] pharmacy on file.  Please let pt know to give update if/when decides if this is a good option for her or not.  Thanks.

## 2017-11-29 NOTE — Telephone Encounter (Signed)
[facility] pharmacy calling to speak with nurse about patient's prescription for pristiq. Patient would like to try a 30 day supply before having to get a prior authorization for the name brand. Pharmacy number [phone]

## 2017-11-30 NOTE — Telephone Encounter (Signed)
Call to patient. She will update [place] in about 3 weeks if still requires brand so that prior authorization can be initiated.  Encounter closed.

## 2017-12-17 ENCOUNTER — Other Ambulatory Visit (HOSPITAL_COMMUNITY)
Admission: RE | Admit: 2017-12-17 | Discharge: 2017-12-17 | Disposition: A | Discharge status: Home / Self Care | Payer: 59 | Source: Ambulatory Visit | Attending: Obstetrics & Gynecology | Admitting: Obstetrics & Gynecology

## 2017-12-17 ENCOUNTER — Other Ambulatory Visit: Payer: Self-pay

## 2017-12-17 ENCOUNTER — Encounter: Payer: Self-pay | Admitting: Obstetrics & Gynecology

## 2017-12-17 ENCOUNTER — Ambulatory Visit (INDEPENDENT_AMBULATORY_CARE_PROVIDER_SITE_OTHER): Payer: 59 | Admitting: Obstetrics & Gynecology

## 2017-12-17 VITALS — BP 146/82 | HR 88 | Resp 16 | Ht 62.25 in | Wt 179.4 lb

## 2017-12-17 DIAGNOSIS — Z124 Encounter for screening for malignant neoplasm of cervix: Secondary | ICD-10-CM

## 2017-12-17 DIAGNOSIS — E2839 Other primary ovarian failure: Secondary | ICD-10-CM | POA: Diagnosis not present

## 2017-12-17 DIAGNOSIS — Z01419 Encounter for gynecological examination (general) (routine) without abnormal findings: Secondary | ICD-10-CM

## 2017-12-17 DIAGNOSIS — Z0000 Encounter for general adult medical examination without abnormal findings: Secondary | ICD-10-CM

## 2017-12-17 MED ORDER — TRIAMCINOLONE ACETONIDE 0.1 % EX CREA
1.0000 | TOPICAL_CREAM | Freq: Two times a day (BID) | CUTANEOUS | 1 refills | Status: DC
Start: 2017-12-17 — End: 2020-07-17

## 2017-12-17 MED ORDER — DESVENLAFAXINE SUCCINATE ER 100 MG PO TB24: 100.0000 mg | ORAL_TABLET | Freq: Every day | ORAL | 4 refills | Status: DC

## 2017-12-17 NOTE — Progress Notes (Signed)
57 y.o. [id number] Married White or Caucasian female here for annual exam.  [place] [address] down and fractured foot.  Had surgery 10/28/17.  Wearing a larger boot right now.  Pain has been really good since surgery.  Also having a lot more left knee pain.  Now weight bearing and this has helped the knee.   Denies vaginal bleeding.     Has tried the generic Prestiq the last month.  Has been working fine for her.  RF for medication sent to pharmacy for 90 day supply.  Patient's last menstrual period was 02/23/2009.          Sexually active: No.  The current method of family planning is post menopausal status.    Exercising: Yes.    walk, weights Smoker:  no  Health Maintenance: Pap:  12/18/14 Neg   10/23/13 Neg. HR HPV:Neg  History of abnormal Pap:  no MMG:  05/20/16 BIRADS1:Neg  Colonoscopy: 2012 f/u 10 years  BMD:   Never  TDaP:  2015 Pneumonia vaccine(s):  n/a Shingrix:   No Hep C testing: 04/07/16 neg  Screening Labs: Here today    reports that she has never smoked. She has never used smokeless tobacco. She reports that she drinks about 7.0 standard drinks of alcohol per week. She reports that she does not use drugs.  Past Medical History:  Diagnosis Date  . Depression    anxiety  . Insomnia   . Kidney stones    multiple-4/16  . SUI (stress urinary incontinence, female)     Past Surgical History:  Procedure Laterality Date  . CYSTOSCOPY/RETROGRADE/URETEROSCOPY/STONE EXTRACTION WITH BASKET  4/16   Dr. Jonette Eva  . FOOT FRACTURE SURGERY Right 2019  . FOOT SURGERY  2005   right foot  . LAPAROSCOPIC APPENDECTOMY  2006  . LITHOTRIPSY  4/16   Dr. Jonette Eva  . URETHRAL SLING  2/11   Dr Matilde Sprang     Current Outpatient Medications  Medication Sig Dispense Refill  . aspirin EC 81 MG tablet Take by mouth daily.    Marland Kitchen desvenlafaxine (PRISTIQ) 100 MG 24 hr tablet Take 1 tablet (100 mg total) by mouth daily. 30 tablet 0  . diphenhydramine-acetaminophen (TYLENOL PM) 25-500 MG TABS Take 1  tablet by mouth at bedtime as needed.    . fluticasone (FLONASE) 50 MCG/ACT nasal spray daily as needed.    Marland Kitchen ibuprofen (ADVIL,MOTRIN) 600 MG tablet Take 1 tablet (600 mg total) by mouth every 6 (six) hours as needed. 30 tablet 0  . loratadine-pseudoephedrine (CLARITIN-D 12-HOUR) 5-120 MG tablet Take by mouth daily as needed.    . meloxicam (MOBIC) 7.5 MG tablet Take 7.5 mg by mouth.    . Nutritional Supplements (JUICE PLUS FIBRE PO) Take by mouth.    . nystatin cream (MYCOSTATIN) Apply 1 application topically 2 (two) times daily. Apply to affected area BID for up to 7 days. 30 g 0  . Potassium Citrate 15 MEQ (1620 MG) TBCR Take 1 tablet by mouth 2 (two) times daily with a meal.  12  . Pseudoeph-Doxylamine-DM-APAP (NYQUIL PO) Take by mouth as needed.     No current facility-administered medications for this visit.     Family History  Problem Relation Age of Onset  . Hypertension Mother   . Thyroid disease Mother   . Cancer Mother        lung  . Diabetes Paternal Grandfather   . Heart disease Father   . Heart disease Brother     Review  of Systems  All other systems reviewed and are negative.   Exam:   BP (!) 146/82 (BP Location: Right Arm, Patient Position: Sitting, Cuff Size: Large)   Pulse 88   Resp 16   Ht 5' 2.25" (1.581 m)   Wt 179 lb 6.4 oz (81.4 kg)   LMP 02/23/2009   BMI 32.55 kg/m    Height: 5' 2.25" (158.1 cm)  Ht Readings from Last 3 Encounters:  12/17/17 5' 2.25" (1.581 m)  10/06/17 5\' 3"  (1.6 m)  04/07/16 5' 2.5" (1.588 m)    General appearance: alert, cooperative and appears stated age Head: Normocephalic, without obvious abnormality, atraumatic Neck: no adenopathy, supple, symmetrical, trachea midline and thyroid normal to inspection and palpation Lungs: clear to auscultation bilaterally Breasts: normal appearance, no masses or tenderness Heart: regular rate and rhythm Abdomen: soft, non-tender; bowel sounds normal; no masses,  no  organomegaly Extremities: extremities normal, atraumatic, no cyanosis or edema Skin: Skin color, texture, turgor normal. No rashes or lesions Lymph nodes: Cervical, supraclavicular, and axillary nodes normal. No abnormal inguinal nodes palpated Neurologic: Grossly normal   Pelvic: External genitalia:  no lesions              Urethra:  normal appearing urethra with no masses, tenderness or lesions              Bartholins and Skenes: normal                 Vagina: normal appearing vagina with normal color and discharge, no lesions              Cervix: no lesions              Pap taken: Yes.   Bimanual Exam:  Uterus:  normal size, contour, position, consistency, mobility, non-tender              Adnexa: normal adnexa and no mass, fullness, tenderness               Rectovaginal: Confirms               Anus:  normal sphincter tone, no lesions  Chaperone was present for exam.  A:  Well Woman with normal exam  P:   Mammogram guidelines reviewed.  Doing 3D MMG. pap smear with HR HPV obtained today TSH, Lipids, CMP and CBC placed as future orders.  She will return for these labs BMD ordered.  She will do with next MMG. Prestiq 50mg  daily to pharmacy.  #90/4RF Also RF for triamcinolone ointment sent to pharmacy return annually or prn

## 2017-12-21 LAB — CYTOLOGY - PAP
Diagnosis: NEGATIVE
HPV (WINDOPATH): NOT DETECTED

## 2017-12-23 ENCOUNTER — Ambulatory Visit: Payer: 59 | Admitting: Obstetrics & Gynecology

## 2017-12-29 ENCOUNTER — Other Ambulatory Visit (INDEPENDENT_AMBULATORY_CARE_PROVIDER_SITE_OTHER): Payer: 59

## 2017-12-29 DIAGNOSIS — Z0000 Encounter for general adult medical examination without abnormal findings: Secondary | ICD-10-CM

## 2017-12-29 DIAGNOSIS — R748 Abnormal levels of other serum enzymes: Secondary | ICD-10-CM

## 2017-12-29 DIAGNOSIS — R7989 Other specified abnormal findings of blood chemistry: Secondary | ICD-10-CM

## 2017-12-30 LAB — CBC
HEMATOCRIT: 41.2 % (ref 34.0–46.6)
Hemoglobin: 13.6 g/dL (ref 11.1–15.9)
MCH: 30.3 pg (ref 26.6–33.0)
MCHC: 33 g/dL (ref 31.5–35.7)
MCV: 92 fL (ref 79–97)
Platelets: 479 10*3/uL — ABNORMAL HIGH (ref 150–450)
RBC: 4.49 x10E6/uL (ref 3.77–5.28)
RDW: 12.2 % — AB (ref 12.3–15.4)
WBC: 5.2 10*3/uL (ref 3.4–10.8)

## 2017-12-30 LAB — COMPREHENSIVE METABOLIC PANEL
ALT: 32 IU/L (ref 0–32)
AST: 18 IU/L (ref 0–40)
Albumin/Globulin Ratio: 1.8 (ref 1.2–2.2)
Albumin: 4.3 g/dL (ref 3.5–5.5)
Alkaline Phosphatase: 145 IU/L — ABNORMAL HIGH (ref 39–117)
BUN/Creatinine Ratio: 29 — ABNORMAL HIGH (ref 9–23)
BUN: 20 mg/dL (ref 6–24)
Bilirubin Total: 0.4 mg/dL (ref 0.0–1.2)
CO2: 24 mmol/L (ref 20–29)
CREATININE: 0.69 mg/dL (ref 0.57–1.00)
Calcium: 9.6 mg/dL (ref 8.7–10.2)
Chloride: 104 mmol/L (ref 96–106)
GFR calc Af Amer: 110 mL/min/{1.73_m2} (ref >59)
GFR calc non Af Amer: 96 mL/min/{1.73_m2} (ref >59)
GLUCOSE: 88 mg/dL (ref 65–99)
Globulin, Total: 2.4 g/dL (ref 1.5–4.5)
Potassium: 4.8 mmol/L (ref 3.5–5.2)
Sodium: 142 mmol/L (ref 134–144)
Total Protein: 6.7 g/dL (ref 6.0–8.5)

## 2017-12-30 LAB — LIPID PANEL
Chol/HDL Ratio: 3.7 ratio (ref 0.0–4.4)
Cholesterol, Total: 176 mg/dL (ref 100–199)
HDL: 47 mg/dL (ref >39)
LDL CALC: 104 mg/dL — AB (ref 0–99)
Triglycerides: 123 mg/dL (ref 0–149)
VLDL Cholesterol Cal: 25 mg/dL (ref 5–40)

## 2017-12-30 LAB — TSH: TSH: 2.15 u[IU]/mL (ref 0.450–4.500)

## 2018-02-01 ENCOUNTER — Encounter: Payer: Self-pay | Admitting: Obstetrics & Gynecology

## 2018-02-01 ENCOUNTER — Telehealth: Payer: Self-pay | Admitting: *Deleted

## 2018-02-01 NOTE — Telephone Encounter (Signed)
Hi!!!  I have a lab appt scheduled for tomorrow 02/02/2018.  I just had a Basic Metabolic Panal done at Dr. Fausto Skillern office in the [facility] system   yesterday, 01/31/2018.  The results have been posted.   I am wondering if this BMP is the same thing that I am supposed to be tested for tomorrow in the office.  PLEASE LET ME KNOW ASAP, SO I WILL KNOW WHETHER TO CANCEL OR KEEP THE APPOINTMENT!!!!    Thanks so Much!  Joy Petty

## 2018-02-01 NOTE — Telephone Encounter (Signed)
Reviewed Care Everywhere. BMP collected at [facility] on 01/31/18.   Patient to return to [facility] for 1 mo CBC, elevated platelets and CMP, elevated alkaline phosphatase.   Call returned to patient. Recommended patient keep lab as scheduled. Advised only BMP collected at [facility]. Patient verbalizes understanding and is agreeable.   Routing to provider for final review. Patient is agreeable to disposition. Will close encounter.

## 2018-02-02 ENCOUNTER — Other Ambulatory Visit (INDEPENDENT_AMBULATORY_CARE_PROVIDER_SITE_OTHER): Payer: 59

## 2018-02-02 DIAGNOSIS — R7989 Other specified abnormal findings of blood chemistry: Secondary | ICD-10-CM

## 2018-02-02 DIAGNOSIS — R748 Abnormal levels of other serum enzymes: Secondary | ICD-10-CM

## 2018-02-03 LAB — COMPREHENSIVE METABOLIC PANEL
A/G RATIO: 2.2 (ref 1.2–2.2)
ALT: 31 IU/L (ref 0–32)
AST: 27 IU/L (ref 0–40)
Albumin: 4.7 g/dL (ref 3.5–5.5)
Alkaline Phosphatase: 101 IU/L (ref 39–117)
BILIRUBIN TOTAL: 0.6 mg/dL (ref 0.0–1.2)
BUN/Creatinine Ratio: 20 (ref 9–23)
BUN: 13 mg/dL (ref 6–24)
CHLORIDE: 99 mmol/L (ref 96–106)
CO2: 25 mmol/L (ref 20–29)
CREATININE: 0.64 mg/dL (ref 0.57–1.00)
Calcium: 10 mg/dL (ref 8.7–10.2)
GFR calc Af Amer: 113 mL/min/{1.73_m2} (ref >59)
GFR calc non Af Amer: 98 mL/min/{1.73_m2} (ref >59)
GLOBULIN, TOTAL: 2.1 g/dL (ref 1.5–4.5)
Glucose: 94 mg/dL (ref 65–99)
Potassium: 4.2 mmol/L (ref 3.5–5.2)
Sodium: 139 mmol/L (ref 134–144)
Total Protein: 6.8 g/dL (ref 6.0–8.5)

## 2018-02-03 LAB — CBC
HEMATOCRIT: 41.9 % (ref 34.0–46.6)
HEMOGLOBIN: 14 g/dL (ref 11.1–15.9)
MCH: 29.9 pg (ref 26.6–33.0)
MCHC: 33.4 g/dL (ref 31.5–35.7)
MCV: 90 fL (ref 79–97)
Platelets: 317 10*3/uL (ref 150–450)
RBC: 4.68 x10E6/uL (ref 3.77–5.28)
RDW: 13.1 % (ref 12.3–15.4)
WBC: 5.8 10*3/uL (ref 3.4–10.8)

## 2018-02-07 ENCOUNTER — Other Ambulatory Visit: Payer: Self-pay | Admitting: Obstetrics & Gynecology

## 2018-02-07 DIAGNOSIS — Z1231 Encounter for screening mammogram for malignant neoplasm of breast: Secondary | ICD-10-CM

## 2018-04-01 ENCOUNTER — Ambulatory Visit
Admission: RE | Admit: 2018-04-01 | Discharge: 2018-04-01 | Disposition: A | Discharge status: Home / Self Care | Payer: 59 | Source: Ambulatory Visit | Attending: Obstetrics & Gynecology | Admitting: Obstetrics & Gynecology

## 2018-04-01 DIAGNOSIS — E2839 Other primary ovarian failure: Secondary | ICD-10-CM

## 2018-04-01 DIAGNOSIS — Z1231 Encounter for screening mammogram for malignant neoplasm of breast: Secondary | ICD-10-CM

## 2018-10-24 MED FILL — FLUARIX QUADRIVALENT 0.5 ML: 0.5 | 1 days supply | Qty: 1 | Fill #0

## 2018-10-24 MED FILL — SHINGRIX 50 MCG SUS: 50 | 1 days supply | Qty: 1 | Fill #0

## 2018-11-29 ENCOUNTER — Encounter: Payer: Self-pay | Admitting: Internal Medicine

## 2018-11-29 ENCOUNTER — Other Ambulatory Visit: Payer: Self-pay

## 2018-11-29 ENCOUNTER — Ambulatory Visit (INDEPENDENT_AMBULATORY_CARE_PROVIDER_SITE_OTHER): Payer: 59 | Admitting: Internal Medicine

## 2018-11-29 VITALS — BP 128/88 | HR 81 | Temp 98.6°F | Resp 16 | Ht 62.25 in | Wt 184.0 lb

## 2018-11-29 DIAGNOSIS — R1011 Right upper quadrant pain: Secondary | ICD-10-CM | POA: Insufficient documentation

## 2018-11-29 DIAGNOSIS — N200 Calculus of kidney: Secondary | ICD-10-CM | POA: Diagnosis not present

## 2018-11-29 DIAGNOSIS — F411 Generalized anxiety disorder: Secondary | ICD-10-CM | POA: Diagnosis not present

## 2018-11-29 DIAGNOSIS — E669 Obesity, unspecified: Secondary | ICD-10-CM | POA: Insufficient documentation

## 2018-11-29 DIAGNOSIS — F3289 Other specified depressive episodes: Secondary | ICD-10-CM | POA: Diagnosis not present

## 2018-11-29 DIAGNOSIS — Z6833 Body mass index (BMI) 33.0-33.9, adult: Secondary | ICD-10-CM

## 2018-11-29 DIAGNOSIS — M79671 Pain in right foot: Secondary | ICD-10-CM | POA: Insufficient documentation

## 2018-11-29 DIAGNOSIS — E6609 Other obesity due to excess calories: Secondary | ICD-10-CM

## 2018-11-29 DIAGNOSIS — M79672 Pain in left foot: Secondary | ICD-10-CM

## 2018-11-29 NOTE — Progress Notes (Signed)
Subjective:    Patient ID: Joy Petty, female    DOB: 03-30-1960, 58 y.o.   MRN: [id number]  HPI  She is here to establish with a new pcp.  The patient is here for follow up.  She is not exercising regularly.     Weight: she has always been concerned about her weight.  This is been a lifelong battle.  She is not currently exercising.  She in general does not like exercising.  She thinks she is eating overall healthy.  Heart concerns:  Her dad had heart disease on his side of the family.  She does worry about her risk of heart disease.  She is not having any concerning symptoms.  Pain across upper abdomen:  It started a few months ago.  The upper abdomen feels swollen and tender when the pain comes.  The past time it went through to her right mid back.  The last episode lasted 12 hours.  It typically occurs at night.  She denies GERD on a regular basis, she has occasional reflux.  She took Tums once with an episode and it may have helped minimally.      Kidney stones:  She constantly has stones.  Follows with urology Taking potassium citrate and indapamide.  She has chronic foot pain and takes Mobic daily for it.  This helps control the pain.  The pain can radiate up her legs to her knees.     Medications and allergies reviewed with patient and updated if appropriate.  Patient Active Problem List   Diagnosis Date Noted  . Lichen sclerosus 0000000  . Depression 01/13/2015  . Generalized anxiety disorder 01/13/2015  . Calculus of kidney 06/20/2014  . Calculi, urethra 06/20/2014  . Toe pain, right 10/23/2013  . Headache(784.0) 11/19/2012  . Family history of heart attack 01/15/2012    Current Outpatient Medications on File Prior to Visit  Medication Sig Dispense Refill  . aspirin EC 81 MG tablet Take 81 mg by mouth daily.    Marland Kitchen desvenlafaxine (PRISTIQ) 100 MG 24 hr tablet Take 1 tablet (100 mg total) by mouth daily. (Patient taking differently: Take 100 mg by mouth  daily. Takes 1/2 tablet daily) 90 tablet 4  . fluticasone (FLONASE) 50 MCG/ACT nasal spray daily as needed.    Marland Kitchen ibuprofen (ADVIL,MOTRIN) 600 MG tablet Take 1 tablet (600 mg total) by mouth every 6 (six) hours as needed. 30 tablet 0  . indapamide (LOZOL) 1.25 MG tablet Take 1.25 mg by mouth daily.    . Magnesium 400 MG CAPS Take by mouth.    . meloxicam (MOBIC) 7.5 MG tablet Take 7.5 mg by mouth.    . Nutritional Supplements (JUICE PLUS FIBRE PO) Take by mouth.    . Potassium Citrate 15 MEQ (1620 MG) TBCR Take 1 tablet by mouth 2 (two) times daily with a meal.  12  . triamcinolone cream (KENALOG) 0.1 % Apply 1 application topically 2 (two) times daily. 30 g 1   No current facility-administered medications on file prior to visit.     Past Medical History:  Diagnosis Date  . Anxiety   . Depression    anxiety  . Insomnia   . Kidney stones    multiple-4/16  . SUI (stress urinary incontinence, female)     Past Surgical History:  Procedure Laterality Date  . CYSTOSCOPY/RETROGRADE/URETEROSCOPY/STONE EXTRACTION WITH BASKET  4/16   Dr. Jonette Eva  . FOOT FRACTURE SURGERY Right 2019  . FOOT SURGERY  2005   right foot  . LAPAROSCOPIC APPENDECTOMY  2006  . LITHOTRIPSY  4/16   Dr. Jonette Eva  . URETHRAL SLING  2/11   Dr Matilde Sprang     Social History   Socioeconomic History  . Marital status: Married    Spouse name: Not on file  . Number of children: Not on file  . Years of education: Not on file  . Highest education level: Not on file  Occupational History  . Not on file  Social Needs  . Financial resource strain: Not on file  . Food insecurity    Worry: Not on file    Inability: Not on file  . Transportation needs    Medical: Not on file    Non-medical: Not on file  Tobacco Use  . Smoking status: Never Smoker  . Smokeless tobacco: Never Used  Substance and Sexual Activity  . Alcohol use: Yes    Alcohol/week: 7.0 standard drinks    Types: 7 Standard drinks or  equivalent per week  . Drug use: No  . Sexual activity: Not Currently    Partners: Male    Birth control/protection: Post-menopausal  Lifestyle  . Physical activity    Days per week: Not on file    Minutes per session: Not on file  . Stress: Not on file  Relationships  . Social Herbalist on phone: Not on file    Gets together: Not on file    Attends religious service: Not on file    Active member of club or organization: Not on file    Attends meetings of clubs or organizations: Not on file    Relationship status: Not on file  Other Topics Concern  . Not on file  Social History Narrative  . Not on file    Family History  Problem Relation Age of Onset  . Hypertension Mother   . Thyroid disease Mother   . Cancer Mother        lung  . Diabetes Paternal Grandfather   . Heart disease Father   . Heart disease Brother     Review of Systems  Constitutional: Negative for chills and fever.  Respiratory: Negative for cough, shortness of breath and wheezing.   Cardiovascular: Negative for chest pain, palpitations and leg swelling.  Gastrointestinal: Positive for abdominal pain and nausea (sometimes a little). Negative for blood in stool, constipation and diarrhea.       No GERD  Genitourinary: Negative for dysuria and hematuria.  Musculoskeletal: Positive for arthralgias (my shoulder).       Foot pain  Skin: Negative for color change.  Neurological: Positive for headaches (occasional - allergy, weather, stress, migraines a couple of times a year).  Psychiatric/Behavioral: Positive for dysphoric mood. The patient is nervous/anxious.        Objective:   Vitals:   11/29/18 0825  BP: 128/88  Pulse: 81  Resp: 16  Temp: 98.6 F (37 C)  SpO2: 97%   BP Readings from Last 3 Encounters:  11/29/18 128/88  12/17/17 (!) 146/82  10/06/17 127/84   Wt Readings from Last 3 Encounters:  11/29/18 184 lb (83.5 kg)  12/17/17 179 lb 6.4 oz (81.4 kg)  10/06/17 179 lb  (81.2 kg)   Body mass index is 33.38 kg/m.   Physical Exam    Constitutional: Appears well-developed and well-nourished. No distress.  HENT:  Head: Normocephalic and atraumatic.  Neck: Neck supple. No tracheal deviation present. No thyromegaly present.  No cervical lymphadenopathy Cardiovascular: Normal rate, regular rhythm and normal heart sounds.   No murmur heard. No carotid bruit .  No edema Pulmonary/Chest: Effort normal and breath sounds normal. No respiratory distress. No has no wheezes. No rales. Abdomen: Soft, nondistended, nontender Skin: Skin is warm and dry. Not diaphoretic.  Psychiatric: Normal mood and affect. Behavior is normal.      Assessment & Plan:    See Problem List for Assessment and Plan of chronic medical problems.

## 2018-11-29 NOTE — Assessment & Plan Note (Signed)
Chronic in nature Radiates up towards knees Takes Mobic 7.5 mg daily, which helps

## 2018-11-29 NOTE — Patient Instructions (Addendum)
  Medications reviewed and updated.  Changes include : none      An ultrasound of your gallbladder was ordered.

## 2018-11-29 NOTE — Assessment & Plan Note (Signed)
Generally eats fairly healthy She is not currently exercising and stressed the importance of regular exercise She understands the benefits of weight loss

## 2018-11-29 NOTE — Assessment & Plan Note (Signed)
Intermittent upper abdominal pain for the past few months She has had several attacks Sounds like gallbladder Had CT scan for kidney stones last year, which did show gallstones Will obtain an abdominal ultrasound of her right upper quadrant Most likely will be referred to surgery to consider gallbladder removal

## 2018-11-29 NOTE — Assessment & Plan Note (Signed)
Constantly has stones Follows with urology Taking potassium citrate and indapamide

## 2018-11-29 NOTE — Assessment & Plan Note (Signed)
Taking pristiq - prescribed by Gyn, Dr Sabra Heck Controlled, stable Continue current dose of medication

## 2018-12-01 ENCOUNTER — Encounter: Payer: Self-pay | Admitting: Internal Medicine

## 2018-12-04 ENCOUNTER — Other Ambulatory Visit: Payer: Self-pay | Admitting: Internal Medicine

## 2018-12-15 ENCOUNTER — Encounter: Payer: Self-pay | Admitting: Internal Medicine

## 2018-12-15 ENCOUNTER — Ambulatory Visit
Admission: RE | Admit: 2018-12-15 | Discharge: 2018-12-15 | Disposition: A | Discharge status: Home / Self Care | Payer: 59 | Source: Ambulatory Visit | Attending: Internal Medicine | Admitting: Internal Medicine

## 2018-12-15 DIAGNOSIS — K8020 Calculus of gallbladder without cholecystitis without obstruction: Secondary | ICD-10-CM | POA: Insufficient documentation

## 2018-12-15 DIAGNOSIS — R1011 Right upper quadrant pain: Secondary | ICD-10-CM

## 2018-12-15 DIAGNOSIS — K760 Fatty (change of) liver, not elsewhere classified: Secondary | ICD-10-CM | POA: Insufficient documentation

## 2018-12-29 ENCOUNTER — Other Ambulatory Visit: Payer: Self-pay

## 2018-12-29 DIAGNOSIS — Z20822 Contact with and (suspected) exposure to covid-19: Secondary | ICD-10-CM

## 2018-12-31 LAB — NOVEL CORONAVIRUS, NAA: SARS-CoV-2, NAA: NOT DETECTED

## 2019-01-02 MED FILL — SHINGRIX 50 MCG SUS: 50 | 1 days supply | Qty: 1 | Fill #1

## 2019-01-05 ENCOUNTER — Encounter: Payer: Self-pay | Admitting: Internal Medicine

## 2019-01-09 ENCOUNTER — Encounter: Payer: Self-pay | Admitting: Internal Medicine

## 2019-01-10 MED ORDER — TRAZODONE HCL 50 MG PO TABS: 25.0000 mg | ORAL_TABLET | Freq: Every evening | ORAL | 3 refills | Status: DC | PRN

## 2019-02-08 ENCOUNTER — Other Ambulatory Visit: Payer: Self-pay | Admitting: Internal Medicine

## 2019-02-11 ENCOUNTER — Other Ambulatory Visit: Payer: Self-pay | Admitting: Internal Medicine

## 2019-04-20 ENCOUNTER — Other Ambulatory Visit: Payer: Self-pay

## 2019-04-20 MED ORDER — MELOXICAM 7.5 MG PO TABS: 7.5000 mg | ORAL_TABLET | Freq: Every day | ORAL | 1 refills | Status: DC

## 2019-04-21 ENCOUNTER — Ambulatory Visit: Payer: 59 | Admitting: Obstetrics & Gynecology

## 2019-04-21 ENCOUNTER — Encounter: Payer: Self-pay | Admitting: Obstetrics & Gynecology

## 2019-04-21 VITALS — BP 126/80 | HR 92 | Temp 97.2°F | Resp 10 | Ht 62.25 in | Wt 181.0 lb

## 2019-04-21 DIAGNOSIS — Z01419 Encounter for gynecological examination (general) (routine) without abnormal findings: Secondary | ICD-10-CM

## 2019-04-21 MED ORDER — DESVENLAFAXINE SUCCINATE ER 100 MG PO TB24: 100.0000 mg | ORAL_TABLET | Freq: Every day | ORAL | 4 refills | Status: DC

## 2019-04-21 NOTE — Progress Notes (Signed)
59 y.o. [id number] Married White or Caucasian female here for annual exam.  Had RUQ issues in the fall last year, she had cholelithiasis and fatty liver infiltration.  Doing well.  Denies vaginal bleeding.    Working part time at the [employer].  This is about 20-30 hours a week.  Denies vaginal bleeding.  Patient's last menstrual period was 02/23/2009.          Sexually active: Yes.    The current method of family planning is post menopausal status.    Exercising: No.  The patient does not participate in regular exercise at present. Smoker:  no  Health Maintenance: Pap:  12/17/17 Neg:Neg HR HPV  12/18/14 Neg  10/23/13 Neg:Neg HR HPV History of abnormal Pap:  no MMG:  04/01/18 BIRADS 1 negative/density b Colonoscopy:  2012 f/u 10 years  BMD:   04/01/18 Osteopenia TDaP:  2015 Pneumonia vaccine(s):  n/a Shingrix:   completed Hep C testing: 04/07/16 Neg Screening Labs: PCP   reports that she has never smoked. She has never used smokeless tobacco. She reports current alcohol use of about 7.0 standard drinks of alcohol per week. She reports that she does not use drugs.  Past Medical History:  Diagnosis Date  . Anxiety   . Depression    anxiety  . Insomnia   . Kidney stones    multiple-4/16  . SUI (stress urinary incontinence, female)     Past Surgical History:  Procedure Laterality Date  . CYSTOSCOPY/RETROGRADE/URETEROSCOPY/STONE EXTRACTION WITH BASKET  4/16   Dr. Jonette Eva  . FOOT FRACTURE SURGERY Right 2019  . FOOT SURGERY  2005   right foot  . LAPAROSCOPIC APPENDECTOMY  2006  . LITHOTRIPSY  4/16   Dr. Jonette Eva  . URETHRAL SLING  2/11   Dr Matilde Sprang     Current Outpatient Medications  Medication Sig Dispense Refill  . desvenlafaxine (PRISTIQ) 100 MG 24 hr tablet Take 1 tablet (100 mg total) by mouth daily. (Patient taking differently: Take 100 mg by mouth daily. Takes 1/2 tablet daily) 90 tablet 4  . fluticasone (FLONASE) 50 MCG/ACT nasal spray daily as  needed.    Marland Kitchen ibuprofen (ADVIL,MOTRIN) 600 MG tablet Take 1 tablet (600 mg total) by mouth every 6 (six) hours as needed. 30 tablet 0  . indapamide (LOZOL) 1.25 MG tablet Take 1.25 mg by mouth daily.    . Magnesium 400 MG CAPS Take by mouth.    . meloxicam (MOBIC) 7.5 MG tablet Take 1 tablet (7.5 mg total) by mouth daily. 30 tablet 1  . Nutritional Supplements (JUICE PLUS FIBRE PO) Take by mouth.    . Potassium Citrate 15 MEQ (1620 MG) TBCR Take 1 tablet by mouth 2 (two) times daily with a meal.  12  . traZODone (DESYREL) 50 MG tablet TAKE 0.5-1 TABLETS (25-50 MG TOTAL) BY MOUTH AT BEDTIME AS NEEDED FOR SLEEP. 90 tablet 0  . triamcinolone cream (KENALOG) 0.1 % Apply 1 application topically 2 (two) times daily. 30 g 1  . aspirin EC 81 MG tablet Take 81 mg by mouth daily.     No current facility-administered medications for this visit.    Family History  Problem Relation Age of Onset  . Hypertension Mother   . Thyroid disease Mother   . Lung cancer Mother   . Throat cancer Mother   . Diabetes Paternal Grandfather   . Heart disease Father   . Heart disease Brother   . Kidney cancer Brother   .  Diabetes Other   . Prostate cancer Brother     Review of Systems  All other systems reviewed and are negative.   Exam:   BP 126/80 (BP Location: Right Arm, Patient Position: Sitting, Cuff Size: Normal)   Pulse 92   Temp (!) 97.2 F (36.2 C) (Temporal)   Resp 10   Ht 5' 2.25" (1.581 m)   Wt 181 lb (82.1 kg)   LMP 02/23/2009   BMI 32.84 kg/m      Height: 5' 2.25" (158.1 cm)  Ht Readings from Last 3 Encounters:  04/21/19 5' 2.25" (1.581 m)  11/29/18 5' 2.25" (1.581 m)  12/17/17 5' 2.25" (1.581 m)    General appearance: alert, cooperative and appears stated age Head: Normocephalic, without obvious abnormality, atraumatic Neck: no adenopathy, supple, symmetrical, trachea midline and thyroid normal to inspection and palpation Lungs: clear to auscultation bilaterally Breasts: normal  appearance, no masses or tenderness Heart: regular rate and rhythm Abdomen: soft, non-tender; bowel sounds normal; no masses,  no organomegaly Extremities: extremities normal, atraumatic, no cyanosis or edema Skin: Skin color, texture, turgor normal. No rashes or lesions Lymph nodes: Cervical, supraclavicular, and axillary nodes normal. No abnormal inguinal nodes palpated Neurologic: Grossly normal   Pelvic: External genitalia:  no lesions              Urethra:  normal appearing urethra with no masses, tenderness or lesions              Bartholins and Skenes: normal                 Vagina: normal appearing vagina with normal color and discharge, no lesions              Cervix: no lesions              Pap taken: No. Bimanual Exam:  Uterus:  normal size, contour, position, consistency, mobility, non-tender              Adnexa: normal adnexa and no mass, fullness, tenderness               Rectovaginal: Confirms               Anus:  normal sphincter tone, no lesions  Chaperone, Terence Lux, CMA, was present for exam.  A:  Well Woman with normal exam PMP, no HRT H/o depression Long term issues with nephrolithiasis H/O depression/anxiety  P:   Mammogram guidelines reviewed.  Aware due. pap smear with neg HR HPV 10/19.  Not indicated today Colonoscopy UTD Blood work done with Dr. Quay Burow Vaccines UTD return annually or prn

## 2019-04-21 NOTE — Patient Instructions (Signed)
[email or web address]

## 2019-04-27 ENCOUNTER — Ambulatory Visit: Payer: Self-pay

## 2019-05-07 ENCOUNTER — Other Ambulatory Visit: Payer: Self-pay | Admitting: Internal Medicine

## 2019-05-22 ENCOUNTER — Other Ambulatory Visit: Payer: Self-pay | Admitting: Internal Medicine

## 2019-06-16 ENCOUNTER — Other Ambulatory Visit: Payer: Self-pay | Admitting: Internal Medicine

## 2019-08-02 ENCOUNTER — Other Ambulatory Visit: Payer: Self-pay | Admitting: Obstetrics & Gynecology

## 2019-08-02 DIAGNOSIS — Z1231 Encounter for screening mammogram for malignant neoplasm of breast: Secondary | ICD-10-CM

## 2019-09-04 ENCOUNTER — Other Ambulatory Visit: Payer: Self-pay | Admitting: Internal Medicine

## 2019-09-05 ENCOUNTER — Ambulatory Visit
Admission: RE | Admit: 2019-09-05 | Discharge: 2019-09-05 | Disposition: A | Discharge status: Home / Self Care | Payer: 59 | Source: Ambulatory Visit | Attending: Obstetrics & Gynecology | Admitting: Obstetrics & Gynecology

## 2019-09-05 ENCOUNTER — Other Ambulatory Visit: Payer: Self-pay

## 2019-09-05 DIAGNOSIS — Z1231 Encounter for screening mammogram for malignant neoplasm of breast: Secondary | ICD-10-CM

## 2019-10-26 ENCOUNTER — Other Ambulatory Visit: Payer: Self-pay | Admitting: Internal Medicine

## 2019-10-26 MED ORDER — MELOXICAM 7.5 MG PO TABS: 7.5000 mg | ORAL_TABLET | Freq: Every day | ORAL | 0 refills | Status: DC

## 2019-11-29 ENCOUNTER — Encounter: Payer: 59 | Admitting: Internal Medicine

## 2019-12-13 ENCOUNTER — Ambulatory Visit (INDEPENDENT_AMBULATORY_CARE_PROVIDER_SITE_OTHER): Payer: 59 | Admitting: Internal Medicine

## 2019-12-13 ENCOUNTER — Encounter: Payer: Self-pay | Admitting: Internal Medicine

## 2019-12-13 ENCOUNTER — Other Ambulatory Visit: Payer: Self-pay

## 2019-12-13 VITALS — BP 116/82 | HR 82 | Temp 98.0°F | Ht 62.25 in | Wt 153.0 lb

## 2019-12-13 DIAGNOSIS — Z0000 Encounter for general adult medical examination without abnormal findings: Secondary | ICD-10-CM | POA: Diagnosis not present

## 2019-12-13 DIAGNOSIS — R739 Hyperglycemia, unspecified: Secondary | ICD-10-CM | POA: Insufficient documentation

## 2019-12-13 DIAGNOSIS — F4323 Adjustment disorder with mixed anxiety and depressed mood: Secondary | ICD-10-CM

## 2019-12-13 DIAGNOSIS — F3289 Other specified depressive episodes: Secondary | ICD-10-CM | POA: Diagnosis not present

## 2019-12-13 DIAGNOSIS — K760 Fatty (change of) liver, not elsewhere classified: Secondary | ICD-10-CM

## 2019-12-13 DIAGNOSIS — F411 Generalized anxiety disorder: Secondary | ICD-10-CM | POA: Diagnosis not present

## 2019-12-13 DIAGNOSIS — G479 Sleep disorder, unspecified: Secondary | ICD-10-CM

## 2019-12-13 DIAGNOSIS — Z23 Encounter for immunization: Secondary | ICD-10-CM

## 2019-12-13 DIAGNOSIS — E663 Overweight: Secondary | ICD-10-CM

## 2019-12-13 NOTE — Assessment & Plan Note (Addendum)
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  She feels a little lost and that she has no purpose Discussed medication options-she is only taking 50 the Pristiq.  She has made transitions with medications in the past and it was difficult.  We will try increasing Pristiq to 100 mg.  Discussed this may not make any difference, but it is the easiest thing to try first Referred to a therapist Discussed possible life coach or other to help her transition through this time Follow-up in 1 month

## 2019-12-13 NOTE — Assessment & Plan Note (Signed)
Chronic Difficult to say if her anxiety is truly controlled or not because of everything else going on Taking 50 mg of Pristiq now which is currently prescribed by her gynecologist-advised her to increase this to 100 and see how she does with the higher dose

## 2019-12-13 NOTE — Patient Instructions (Addendum)
Blood work was ordered.    All other Health Maintenance issues reviewed.   All recommended immunizations and age-appropriate screenings are up-to-date or discussed.  Flu immunization administered today.    Medications reviewed and updated.  Changes include :  Try taking pristiq 100 mg daily    A referral was ordered for a therapist.     Someone will call you to schedule an appointment.    Please followup in 1 month     Health Maintenance, Female Adopting a healthy lifestyle and getting preventive care are important in promoting health and wellness. Ask your health care provider about:  The right schedule for you to have regular tests and exams.  Things you can do on your own to prevent diseases and keep yourself healthy. What should I know about diet, weight, and exercise? Eat a healthy diet   Eat a diet that includes plenty of vegetables, fruits, low-fat dairy products, and lean protein.  Do not eat a lot of foods that are high in solid fats, added sugars, or sodium. Maintain a healthy weight Body mass index (BMI) is used to identify weight problems. It estimates body fat based on height and weight. Your health care provider can help determine your BMI and help you achieve or maintain a healthy weight. Get regular exercise Get regular exercise. This is one of the most important things you can do for your health. Most adults should:  Exercise for at least 150 minutes each week. The exercise should increase your heart rate and make you sweat (moderate-intensity exercise).  Do strengthening exercises at least twice a week. This is in addition to the moderate-intensity exercise.  Spend less time sitting. Even light physical activity can be beneficial. Watch cholesterol and blood lipids Have your blood tested for lipids and cholesterol at 61 years of age, then have this test every 5 years. Have your cholesterol levels checked more often if:  Your lipid or cholesterol levels  are high.  You are older than 61 years of age.  You are at high risk for heart disease. What should I know about cancer screening? Depending on your health history and family history, you may need to have cancer screening at various ages. This may include screening for:  Breast cancer.  Cervical cancer.  Colorectal cancer.  Skin cancer.  Lung cancer. What should I know about heart disease, diabetes, and high blood pressure? Blood pressure and heart disease  High blood pressure causes heart disease and increases the risk of stroke. This is more likely to develop in people who have high blood pressure readings, are of African descent, or are overweight.  Have your blood pressure checked: ? Every 3-5 years if you are 42-25 years of age. ? Every year if you are 78 years old or older. Diabetes Have regular diabetes screenings. This checks your fasting blood sugar level. Have the screening done:  Once every three years after age 49 if you are at a normal weight and have a low risk for diabetes.  More often and at a younger age if you are overweight or have a high risk for diabetes. What should I know about preventing infection? Hepatitis B If you have a higher risk for hepatitis B, you should be screened for this virus. Talk with your health care provider to find out if you are at risk for hepatitis B infection. Hepatitis C Testing is recommended for:  Everyone born from [birth year] through 1967.  Anyone with known risk factors for  hepatitis C. Sexually transmitted infections (STIs)  Get screened for STIs, including gonorrhea and chlamydia, if: ? You are sexually active and are younger than 61 years of age. ? You are older than 61 years of age and your health care provider tells you that you are at risk for this type of infection. ? Your sexual activity has changed since you were last screened, and you are at increased risk for chlamydia or gonorrhea. Ask your health care provider if  you are at risk.  Ask your health care provider about whether you are at high risk for HIV. Your health care provider may recommend a prescription medicine to help prevent HIV infection. If you choose to take medicine to prevent HIV, you should first get tested for HIV. You should then be tested every 3 months for as long as you are taking the medicine. Pregnancy  If you are about to stop having your period (premenopausal) and you may become pregnant, seek counseling before you get pregnant.  Take 400 to 800 micrograms (mcg) of folic acid every day if you become pregnant.  Ask for birth control (contraception) if you want to prevent pregnancy. Osteoporosis and menopause Osteoporosis is a disease in which the bones lose minerals and strength with aging. This can result in bone fractures. If you are 66 years old or older, or if you are at risk for osteoporosis and fractures, ask your health care provider if you should:  Be screened for bone loss.  Take a calcium or vitamin D supplement to lower your risk of fractures.  Be given hormone replacement therapy (HRT) to treat symptoms of menopause. Follow these instructions at home: Lifestyle  Do not use any products that contain nicotine or tobacco, such as cigarettes, e-cigarettes, and chewing tobacco. If you need help quitting, ask your health care provider.  Do not use street drugs.  Do not share needles.  Ask your health care provider for help if you need support or information about quitting drugs. Alcohol use  Do not drink alcohol if: ? Your health care provider tells you not to drink. ? You are pregnant, may be pregnant, or are planning to become pregnant.  If you drink alcohol: ? Limit how much you use to 0-1 drink a day. ? Limit intake if you are breastfeeding.  Be aware of how much alcohol is in your drink. In the [place] one drink equals one 12 oz bottle of beer (355 mL), one 5 oz glass of wine (148 mL), or one 1 oz glass of  hard liquor (44 mL). General instructions  Schedule regular health, dental, and eye exams.  Stay current with your vaccines.  Tell your health care provider if: ? You often feel depressed. ? You have ever been abused or do not feel safe at home. Summary  Adopting a healthy lifestyle and getting preventive care are important in promoting health and wellness.  Follow your health care provider's instructions about healthy diet, exercising, and getting tested or screened for diseases.  Follow your health care provider's instructions on monitoring your cholesterol and blood pressure. This information is not intended to replace advice given to you by your health care provider. Make sure you discuss any questions you have with your health care provider. Document Revised: 02/02/2018 Document Reviewed: 02/02/2018 Elsevier Patient Education  2020 [employer].

## 2019-12-13 NOTE — Assessment & Plan Note (Signed)
Chronic Has lost weight, which will likely improve this CMP

## 2019-12-13 NOTE — Assessment & Plan Note (Signed)
Chronic Pristiq 50 mg daily was controlling her depression well until going through these life changes now having some adjustment disorder with some anxiety and depression most likely Trial of increasing Pristiq to 100 mg daily to see if that helps Follow-up in 1 month Referral ordered for a therapist Discussed possibly looking into a life coach

## 2019-12-13 NOTE — Assessment & Plan Note (Signed)
Chronic Check A1c She has lost significant weight Continue regular exercise and low-carb/sugar diet

## 2019-12-13 NOTE — Assessment & Plan Note (Signed)
Chronic Did not tolerate trazodone She is taking Tylenol PM and is sleeping well with it-discussed concerns with taking it long-term Encouraged over-the-counter sleep supplement that contains melatonin and other herbs

## 2019-12-13 NOTE — Progress Notes (Signed)
Subjective:    Patient ID: Joy Petty, female    DOB: [birth year], 59 y.o.   MRN: [id number]   This visit occurred during the SARS-CoV-2 public health emergency.  Safety protocols were in place, including screening questions prior to the visit, additional usage of staff PPE, and extensive cleaning of exam room while observing appropriate contact time as indicated for disinfecting solutions.    HPI She is here for a physical exam.   She has lost weight - about 30 lbs. a friend helped her and he had a few questions about blood work.  He wanted her to ask about insulin resistance.  She has been doing some exercise-she tries to do 3 days a week.  She has changed her eating habits.  She has been feeling sad.  She is not sure if she is depressed or not.  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  She has also been cutting back and how much she is working.  To some degree she feels no purpose and not quite sure what she supposed to do.  She feels a little loss.  She is not sure if she is depressed, but does feel sad and anxious.  She is not sure what to do at this point.  She is taking Pristiq, but only 50 mg daily.   Medications and allergies reviewed with patient and updated if appropriate.  Patient Active Problem List   Diagnosis Date Noted  . Hyperglycemia 12/13/2019  . Fatty liver, [place] 11/2018 12/15/2018  . Cholelithiasis 12/15/2018  . Foot pain, bilateral 11/29/2018  . Colicky RUQ abdominal pain 11/29/2018  . Obese 11/29/2018  . Lichen sclerosus 87/68/1157  . Depression 01/13/2015  . Generalized anxiety disorder 01/13/2015  . Nephrolithiasis 06/20/2014  . Family history of heart attack 01/15/2012    Current Outpatient Medications on File Prior to Visit  Medication Sig Dispense Refill  . desvenlafaxine (PRISTIQ) 100 MG 24 hr  tablet Take 1 tablet (100 mg total) by mouth daily. 90 tablet 4  . fluticasone (FLONASE) 50 MCG/ACT nasal spray daily as needed.    Marland Kitchen ibuprofen (ADVIL,MOTRIN) 600 MG tablet Take 1 tablet (600 mg total) by mouth every 6 (six) hours as needed. 30 tablet 0  . indapamide (LOZOL) 1.25 MG tablet Take 1.25 mg by mouth daily.    . Magnesium 400 MG CAPS Take by mouth.    . meloxicam (MOBIC) 7.5 MG tablet Take 1 tablet (7.5 mg total) by mouth daily. Must keep scheduled appt for future refills 90 tablet 0  . Nutritional Supplements (JUICE PLUS FIBRE PO) Take by mouth.    . Potassium Citrate 15 MEQ (1620 MG) TBCR Take 1 tablet by mouth 2 (two) times daily with a meal.  12  . triamcinolone cream (KENALOG) 0.1 % Apply 1 application topically 2 (two) times daily. 30 g 1  . traZODone (DESYREL) 50 MG tablet Take 0.5-1 tablets (25-50 mg total) by mouth at bedtime as needed for sleep. Need office visit for more refills. (Patient not taking: Reported on 12/13/2019) 30 tablet 0   No current facility-administered medications on file prior to visit.    Past Medical History:  Diagnosis Date  . Anxiety   . Depression    anxiety  . Insomnia   . Kidney stones    multiple-4/16  . SUI (stress  urinary incontinence, female)     Past Surgical History:  Procedure Laterality Date  . CYSTOSCOPY/RETROGRADE/URETEROSCOPY/STONE EXTRACTION WITH BASKET  4/16   Dr. Jonette Eva  . FOOT FRACTURE SURGERY Right 2019  . FOOT SURGERY  2005   right foot  . LAPAROSCOPIC APPENDECTOMY  2006  . LITHOTRIPSY  4/16   Dr. Jonette Eva  . URETHRAL SLING  2/11   Dr Matilde Sprang     Social History   Socioeconomic History  . Marital status: Married    Spouse name: Not on file  . Number of children: Not on file  . Years of education: Not on file  . Highest education level: Not on file  Occupational History  . Not on file  Tobacco Use  . Smoking status: Never Smoker  . Smokeless tobacco: Never Used  Vaping Use  . Vaping Use: Never  used  Substance and Sexual Activity  . Alcohol use: Yes    Alcohol/week: 7.0 standard drinks    Types: 7 Standard drinks or equivalent per week  . Drug use: No  . Sexual activity: Yes    Partners: Male    Birth control/protection: Post-menopausal  Other Topics Concern  . Not on file  Social History Narrative  . Not on file   Social Determinants of Health   Financial Resource Strain:   . Difficulty of Paying Living Expenses: Not on file  Food Insecurity:   . Worried About Charity fundraiser in the Last Year: Not on file  . Ran Out of Food in the Last Year: Not on file  Transportation Needs:   . Lack of Transportation (Medical): Not on file  . Lack of Transportation (Non-Medical): Not on file  Physical Activity:   . Days of Exercise per Week: Not on file  . Minutes of Exercise per Session: Not on file  Stress:   . Feeling of Stress : Not on file  Social Connections:   . Frequency of Communication with Friends and Family: Not on file  . Frequency of Social Gatherings with Friends and Family: Not on file  . Attends Religious Services: Not on file  . Active Member of Clubs or Organizations: Not on file  . Attends Archivist Meetings: Not on file  . Marital Status: Not on file    Family History  Problem Relation Age of Onset  . Hypertension Mother   . Thyroid disease Mother   . Lung cancer Mother   . Throat cancer Mother   . Diabetes Paternal Grandfather   . Heart disease Father   . Heart disease Brother   . Kidney cancer Brother   . Diabetes Other   . Prostate cancer Brother     Review of Systems  Constitutional: Negative for chills and fever.       Energy level -medium to low  Eyes: Negative for visual disturbance.  Respiratory: Negative for cough, shortness of breath and wheezing.   Cardiovascular: Negative for chest pain, palpitations and leg swelling.  Gastrointestinal: Negative for abdominal pain, blood in stool, constipation, diarrhea and nausea.         No gerd, occ fecal incontinence  Genitourinary: Negative for dysuria and hematuria.       Stress incontinence  Musculoskeletal: Positive for arthralgias and joint swelling (from arthritis).  Skin: Positive for rash (patch - on right forearm - itchy - present for 3 years).  Neurological: Positive for headaches. Negative for light-headedness.  Psychiatric/Behavioral: Positive for dysphoric mood and sleep disturbance. The  patient is nervous/anxious.        Objective:   Vitals:   12/13/19 1316  BP: 116/82  Pulse: 82  Temp: 98 F (36.7 C)  SpO2: 98%   Filed Weights   12/13/19 1316  Weight: 153 lb (69.4 kg)   Body mass index is 27.76 kg/m.  BP Readings from Last 3 Encounters:  12/13/19 116/82  04/21/19 126/80  11/29/18 128/88    Wt Readings from Last 3 Encounters:  12/13/19 153 lb (69.4 kg)  04/21/19 181 lb (82.1 kg)  11/29/18 184 lb (83.5 kg)     Physical Exam Constitutional: She appears well-developed and well-nourished. No distress.  HENT:  Head: Normocephalic and atraumatic.  Right Ear: External ear normal. Normal ear canal and TM Left Ear: External ear normal.  Normal ear canal and TM Mouth/Throat: Oropharynx is clear and moist.  Eyes: Conjunctivae and EOM are normal.  Neck: Neck supple. No tracheal deviation present. No thyromegaly present.  No carotid bruit  Cardiovascular: Normal rate, regular rhythm and normal heart sounds.   No murmur heard.  No edema. Pulmonary/Chest: Effort normal and breath sounds normal. No respiratory distress. She has no wheezes. She has no rales.  Breast: deferred   Abdominal: Soft. She exhibits no distension. There is no tenderness.  Lymphadenopathy: She has no cervical adenopathy.  Skin: Skin is warm and dry. She is not diaphoretic.  Psychiatric: She has a normal mood and affect. Her behavior is normal.        Assessment & Plan:   Physical exam: Screening blood work    ordered Immunizations  Had covid, flu vaccine  today, other up to date Colonoscopy  Up to date  Mammogram  Up to date  Gyn  Up to date  Eye exams  Up to date  Exercise  3/ week or more - walking Weight  Has lost a significant amt of weight - working on weight loss Substance abuse  none      See Problem List for Assessment and Plan of chronic medical problems.

## 2019-12-14 ENCOUNTER — Other Ambulatory Visit (INDEPENDENT_AMBULATORY_CARE_PROVIDER_SITE_OTHER): Payer: 59

## 2019-12-14 ENCOUNTER — Other Ambulatory Visit: Payer: 59

## 2019-12-14 DIAGNOSIS — K760 Fatty (change of) liver, not elsewhere classified: Secondary | ICD-10-CM

## 2019-12-14 DIAGNOSIS — Z0000 Encounter for general adult medical examination without abnormal findings: Secondary | ICD-10-CM | POA: Diagnosis not present

## 2019-12-14 DIAGNOSIS — R739 Hyperglycemia, unspecified: Secondary | ICD-10-CM | POA: Diagnosis not present

## 2019-12-14 DIAGNOSIS — E663 Overweight: Secondary | ICD-10-CM

## 2019-12-14 LAB — CBC WITH DIFFERENTIAL/PLATELET
Basophils Absolute: 0 10*3/uL (ref 0.0–0.1)
Basophils Relative: 0.9 % (ref 0.0–3.0)
Eosinophils Absolute: 0.1 10*3/uL (ref 0.0–0.7)
Eosinophils Relative: 1.6 % (ref 0.0–5.0)
HCT: 39.5 % (ref 36.0–46.0)
Hemoglobin: 13.8 g/dL (ref 12.0–15.0)
Lymphocytes Relative: 15.8 % (ref 12.0–46.0)
Lymphs Abs: 0.7 10*3/uL (ref 0.7–4.0)
MCHC: 35 g/dL (ref 30.0–36.0)
MCV: 89.3 fl (ref 78.0–100.0)
Monocytes Absolute: 0.3 10*3/uL (ref 0.1–1.0)
Monocytes Relative: 7.3 % (ref 3.0–12.0)
Neutro Abs: 3.3 10*3/uL (ref 1.4–7.7)
Neutrophils Relative %: 74.4 % (ref 43.0–77.0)
Platelets: 209 10*3/uL (ref 150.0–400.0)
RBC: 4.42 Mil/uL (ref 3.87–5.11)
RDW: 13 % (ref 11.5–15.5)
WBC: 4.4 10*3/uL (ref 4.0–10.5)

## 2019-12-14 LAB — HEMOGLOBIN A1C: Hgb A1c MFr Bld: 5.4 % (ref 4.6–6.5)

## 2019-12-14 LAB — COMPREHENSIVE METABOLIC PANEL
ALT: 65 U/L — ABNORMAL HIGH (ref 0–35)
AST: 49 U/L — ABNORMAL HIGH (ref 0–37)
Albumin: 4.4 g/dL (ref 3.5–5.2)
Alkaline Phosphatase: 61 U/L (ref 39–117)
BUN: 24 mg/dL — ABNORMAL HIGH (ref 6–23)
CO2: 28 mEq/L (ref 19–32)
Calcium: 9.6 mg/dL (ref 8.4–10.5)
Chloride: 98 mEq/L (ref 96–112)
Creatinine, Ser: 0.65 mg/dL (ref 0.40–1.20)
GFR: 95.24 mL/min (ref >60.00)
Glucose, Bld: 93 mg/dL (ref 70–99)
Potassium: 3.7 mEq/L (ref 3.5–5.1)
Sodium: 137 mEq/L (ref 135–145)
Total Bilirubin: 0.7 mg/dL (ref 0.2–1.2)
Total Protein: 6.6 g/dL (ref 6.0–8.3)

## 2019-12-14 LAB — LIPID PANEL
Cholesterol: 160 mg/dL (ref 0–200)
HDL: 46.9 mg/dL (ref >39.00)
LDL Cholesterol: 95 mg/dL (ref 0–99)
NonHDL: 113.02
Total CHOL/HDL Ratio: 3
Triglycerides: 88 mg/dL (ref 0.0–149.0)
VLDL: 17.6 mg/dL (ref 0.0–40.0)

## 2019-12-14 LAB — TSH: TSH: 2.73 u[IU]/mL (ref 0.35–4.50)

## 2019-12-14 NOTE — Addendum Note (Signed)
Addended by: Boris Lown B on: 12/14/2019 09:10 AM   Modules accepted: Orders

## 2019-12-14 NOTE — Addendum Note (Signed)
Addended by: Marcina Millard on: 12/14/2019 04:13 PM   Modules accepted: Orders

## 2019-12-15 LAB — INSULIN, RANDOM: Insulin: 10 u[IU]/mL

## 2020-01-20 ENCOUNTER — Encounter: Payer: Self-pay | Admitting: Internal Medicine

## 2020-01-21 NOTE — Progress Notes (Signed)
Virtual Visit via Video Note  I connected with Joy Petty on 01/21/20 at 10:30 AM EST by a video enabled telemedicine application and verified that I am speaking with the correct person using two identifiers.   I discussed the limitations of evaluation and management by telemedicine and the availability of in person appointments. The patient expressed understanding and agreed to proceed.  Present for the visit:  Myself, Dr Billey Gosling, Simmie Davies.  The patient is currently at home and I am in the office.    No referring provider.    History of Present Illness: She is here for follow up of her depression.   1 month ago we increased her pristiq to 100 mg daily because she was feeling depression, did not feel like she had purpose, etc.   she has been taking increased dose of Pristiq and feels it has helped a little.  She is still experiencing some depression and trying to find her purpose.  She was called by the therapist office, but does not have an appointment scheduled.  She has been looking into who she wants to see and plans on making an appointment.  She would like to continue the 100 mg of Pristiq.  We reviewed her blood work.  Her LFTs were slightly elevated.  The remaining of her blood work was normal.       ROS   Social History   Socioeconomic History  . Marital status: Married    Spouse name: Not on file  . Number of children: Not on file  . Years of education: Not on file  . Highest education level: Not on file  Occupational History  . Not on file  Tobacco Use  . Smoking status: Never Smoker  . Smokeless tobacco: Never Used  Vaping Use  . Vaping Use: Never used  Substance and Sexual Activity  . Alcohol use: Yes    Alcohol/week: 7.0 standard drinks    Types: 7 Standard drinks or equivalent per week  . Drug use: No  . Sexual activity: Yes    Partners: Male    Birth control/protection: Post-menopausal  Other Topics Concern  . Not on file   Social History Narrative  . Not on file   Social Determinants of Health   Financial Resource Strain:   . Difficulty of Paying Living Expenses: Not on file  Food Insecurity:   . Worried About Charity fundraiser in the Last Year: Not on file  . Ran Out of Food in the Last Year: Not on file  Transportation Needs:   . Lack of Transportation (Medical): Not on file  . Lack of Transportation (Non-Medical): Not on file  Physical Activity:   . Days of Exercise per Week: Not on file  . Minutes of Exercise per Session: Not on file  Stress:   . Feeling of Stress : Not on file  Social Connections:   . Frequency of Communication with Friends and Family: Not on file  . Frequency of Social Gatherings with Friends and Family: Not on file  . Attends Religious Services: Not on file  . Active Member of Clubs or Organizations: Not on file  . Attends Archivist Meetings: Not on file  . Marital Status: Not on file     Observations/Objective: Appears well in NAD Breathing normally Mood and affect  Assessment and Plan:  See Problem List for Assessment and Plan of chronic medical problems.   Follow Up Instructions:    I discussed  the assessment and treatment plan with the patient. The patient was provided an opportunity to ask questions and all were answered. The patient agreed with the plan and demonstrated an understanding of the instructions.   The patient was advised to call back or seek an in-person evaluation if the symptoms worsen or if the condition fails to improve as anticipated.    Binnie Rail, MD

## 2020-01-22 ENCOUNTER — Encounter: Payer: Self-pay | Admitting: Internal Medicine

## 2020-01-22 ENCOUNTER — Telehealth (INDEPENDENT_AMBULATORY_CARE_PROVIDER_SITE_OTHER): Payer: 59 | Admitting: Internal Medicine

## 2020-01-22 DIAGNOSIS — R7989 Other specified abnormal findings of blood chemistry: Secondary | ICD-10-CM | POA: Insufficient documentation

## 2020-01-22 DIAGNOSIS — F4323 Adjustment disorder with mixed anxiety and depressed mood: Secondary | ICD-10-CM

## 2020-01-22 NOTE — Assessment & Plan Note (Signed)
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  She also help back on work and is trying to find her new life focus  we increased Pristiq to 100 mg 1 month ago and that has helped some-continue this dose She has been referred to a therapist and will make an appointment.  I think that will help She will let me know if she feels like she needs further medication adjustment

## 2020-01-22 NOTE — Assessment & Plan Note (Signed)
Acute elevated LFTs-mildly 1 month ago We will repeat LFTs when she is able-blood work ordered

## 2020-02-01 ENCOUNTER — Encounter: Payer: Self-pay | Admitting: Internal Medicine

## 2020-02-02 MED ORDER — MELOXICAM 7.5 MG PO TABS: 7.5000 mg | ORAL_TABLET | Freq: Every day | ORAL | 2 refills | Status: DC

## 2020-02-02 MED ORDER — INDAPAMIDE 1.25 MG PO TABS: 1.2500 mg | ORAL_TABLET | Freq: Every day | ORAL | 2 refills | Status: DC

## 2020-02-02 MED ORDER — POTASSIUM CITRATE ER 15 MEQ (1620 MG) PO TBCR: 1.0000 | EXTENDED_RELEASE_TABLET | Freq: Two times a day (BID) | ORAL | 2 refills | Status: DC

## 2020-05-27 ENCOUNTER — Encounter: Payer: Self-pay | Admitting: Internal Medicine

## 2020-05-28 NOTE — Progress Notes (Signed)
Subjective:    Patient ID: Joy Petty, female    DOB: 1960-10-03, 60 y.o.   MRN: [id number]  HPI The patient is here for an acute visit.  She was at the beach last [place].  Saturday she did not feel 100%.  She figured she was just a little tired and stayed in that night, packed and rested.  3 days ago, last Sunday she was driving home from the beach.  She was 35 minutes from home and began to feel very nauseous, lightheaded, headache, some photophobia and then started sweating profusely, then her hands and feet started tingling and eventually parts of her hands and feet went numb.  She was looking for something in her purse and was looking back and forth to the road.  She had difficulty gripping the steering wheel - could not bend her fingers.  She pulled off the road and her symptoms improved.  She went back to driving home and eventually did throw up.  She felt very weak when she got home - she had to have her husband help her get out of the car. She still had numbness in her feet and hands.    She went to bed when she got home and slept most of the day and all night.  The next day her head still felt a little swimmy and sore in the front.  Her blood pressure was 109/83.  She has had a headache since then.    She does have a history of motion sickness, but has never experienced anything like this. It is similar to those symptoms but not that severe.    She has a history of migraines for years.  She typically has a couple a year.  She lays down and takes ibuprofen.  They can last a full day.  She has never had anything like this.   She has taken some tylenol and ibuprofen these past couple of days which may help a little.     Today she feels  - weak, but better  Her head still feels a little groggy - headachy/foggy.  Her eyes feels tired.  No N/T or localized weakness.  No blurry vision.    The whole event was very scary.   Medications and allergies reviewed with  patient and updated if appropriate.  Patient Active Problem List   Diagnosis Date Noted  . Elevated LFTs 01/22/2020  . Hyperglycemia 12/13/2019  . Sleep difficulties 12/13/2019  . Adjustment disorder with mixed anxiety and depressed mood 12/13/2019  . Fatty liver, [place] 11/2018 12/15/2018  . Cholelithiasis 12/15/2018  . Foot pain, bilateral 11/29/2018  . Colicky RUQ abdominal pain 11/29/2018  . Lichen sclerosus 62/56/3893  . Depression 01/13/2015  . Generalized anxiety disorder 01/13/2015  . Nephrolithiasis 06/20/2014  . Family history of heart attack 01/15/2012    Current Outpatient Medications on File Prior to Visit  Medication Sig Dispense Refill  . desvenlafaxine (PRISTIQ) 100 MG 24 hr tablet Take 1 tablet (100 mg total) by mouth daily. 90 tablet 4  . fluticasone (FLONASE) 50 MCG/ACT nasal spray daily as needed.    Marland Kitchen ibuprofen (ADVIL,MOTRIN) 600 MG tablet Take 1 tablet (600 mg total) by mouth every 6 (six) hours as needed. 30 tablet 0  . indapamide (LOZOL) 1.25 MG tablet Take 1 tablet (1.25 mg total) by mouth daily. 90 tablet 2  . Magnesium 400 MG CAPS Take by mouth.    . meloxicam (MOBIC) 7.5 MG tablet Take 1 tablet (  7.5 mg total) by mouth daily. 90 tablet 2  . Nutritional Supplements (JUICE PLUS FIBRE PO) Take by mouth.    . Potassium Citrate 15 MEQ (1620 MG) TBCR Take 1 tablet by mouth 2 (two) times daily with a meal. 180 tablet 2  . triamcinolone cream (KENALOG) 0.1 % Apply 1 application topically 2 (two) times daily. (Patient not taking: Reported on 05/29/2020) 30 g 1   No current facility-administered medications on file prior to visit.    Past Medical History:  Diagnosis Date  . Anxiety   . Depression    anxiety  . Insomnia   . Kidney stones    multiple-4/16  . SUI (stress urinary incontinence, female)     Past Surgical History:  Procedure Laterality Date  . CYSTOSCOPY/RETROGRADE/URETEROSCOPY/STONE EXTRACTION WITH BASKET  4/16   Dr. Jonette Eva  . FOOT FRACTURE  SURGERY Right 2019  . FOOT SURGERY  2005   right foot  . LAPAROSCOPIC APPENDECTOMY  2006  . LITHOTRIPSY  4/16   Dr. Jonette Eva  . URETHRAL SLING  2/11   Dr Matilde Sprang     Social History   Socioeconomic History  . Marital status: Married    Spouse name: Not on file  . Number of children: Not on file  . Years of education: Not on file  . Highest education level: Not on file  Occupational History  . Not on file  Tobacco Use  . Smoking status: Never Smoker  . Smokeless tobacco: Never Used  Vaping Use  . Vaping Use: Never used  Substance and Sexual Activity  . Alcohol use: Yes    Alcohol/week: 7.0 standard drinks    Types: 7 Standard drinks or equivalent per week  . Drug use: No  . Sexual activity: Yes    Partners: Male    Birth control/protection: Post-menopausal  Other Topics Concern  . Not on file  Social History Narrative  . Not on file   Social Determinants of Health   Financial Resource Strain: Not on file  Food Insecurity: Not on file  Transportation Needs: Not on file  Physical Activity: Not on file  Stress: Not on file  Social Connections: Not on file    Family History  Problem Relation Age of Onset  . Hypertension Mother   . Thyroid disease Mother   . Lung cancer Mother   . Throat cancer Mother   . Diabetes Paternal Grandfather   . Heart disease Father   . Heart disease Brother   . Kidney cancer Brother   . Diabetes Other   . Prostate cancer Brother     Review of Systems  Constitutional: Positive for chills (when she first got home - was cold for hours- resolved), diaphoresis and fatigue. Negative for fever.  HENT: Positive for rhinorrhea. Negative for congestion, ear pain, sinus pain and sore throat.   Eyes: Positive for photophobia (initially - when driving ). Negative for visual disturbance.  Respiratory: Negative for cough, shortness of breath and wheezing.   Cardiovascular: Negative for chest pain and palpitations.  Gastrointestinal:  Positive for nausea (resolved) and vomiting (resolved).  Neurological: Positive for weakness (resolved), light-headedness, numbness (resolved) and headaches. Negative for dizziness.       Objective:   Vitals:   05/29/20 0913  BP: 126/80  Pulse: 70  Temp: 98 F (36.7 C)  SpO2: 98%   BP Readings from Last 3 Encounters:  05/29/20 126/80  12/13/19 116/82  04/21/19 126/80   Wt Readings from Last 3  Encounters:  05/29/20 142 lb (64.4 kg)  12/13/19 153 lb (69.4 kg)  04/21/19 181 lb (82.1 kg)   Body mass index is 25.76 kg/m.   Physical Exam Constitutional:      General: She is not in acute distress.    Appearance: Normal appearance. She is not ill-appearing.  HENT:     Head: Normocephalic and atraumatic.     Right Ear: Tympanic membrane, ear canal and external ear normal.     Left Ear: Tympanic membrane, ear canal and external ear normal.     Mouth/Throat:     Mouth: Mucous membranes are moist.     Pharynx: No oropharyngeal exudate or posterior oropharyngeal erythema.  Eyes:     Extraocular Movements: Extraocular movements intact.     Conjunctiva/sclera: Conjunctivae normal.  Neck:     Vascular: No carotid bruit.  Cardiovascular:     Rate and Rhythm: Normal rate and regular rhythm.     Heart sounds: No murmur heard.   Pulmonary:     Effort: Pulmonary effort is normal. No respiratory distress.     Breath sounds: No wheezing or rales.  Musculoskeletal:     Cervical back: Neck supple. No tenderness.     Right lower leg: No edema.     Left lower leg: No edema.  Lymphadenopathy:     Cervical: No cervical adenopathy.  Skin:    General: Skin is warm and dry.  Neurological:     General: No focal deficit present.     Mental Status: She is alert and oriented to person, place, and time.     Cranial Nerves: No cranial nerve deficit.     Sensory: No sensory deficit.     Motor: No weakness.  Psychiatric:        Mood and Affect: Mood normal.            Assessment &  Plan:    See Problem List for Assessment and Plan of chronic medical problems.    This visit occurred during the SARS-CoV-2 public health emergency.  Safety protocols were in place, including screening questions prior to the visit, additional usage of staff PPE, and extensive cleaning of exam room while observing appropriate contact time as indicated for disinfecting solutions.

## 2020-05-29 ENCOUNTER — Other Ambulatory Visit: Payer: Self-pay

## 2020-05-29 ENCOUNTER — Ambulatory Visit (INDEPENDENT_AMBULATORY_CARE_PROVIDER_SITE_OTHER): Payer: 59 | Admitting: Internal Medicine

## 2020-05-29 ENCOUNTER — Encounter: Payer: Self-pay | Admitting: Internal Medicine

## 2020-05-29 DIAGNOSIS — G43909 Migraine, unspecified, not intractable, without status migrainosus: Secondary | ICD-10-CM | POA: Insufficient documentation

## 2020-05-29 DIAGNOSIS — G43109 Migraine with aura, not intractable, without status migrainosus: Secondary | ICD-10-CM | POA: Diagnosis not present

## 2020-05-29 MED ORDER — ONDANSETRON 4 MG PO TBDP: 4.0000 mg | ORAL_TABLET | Freq: Three times a day (TID) | ORAL | 0 refills | Status: DC | PRN

## 2020-05-29 MED ORDER — NURTEC 75 MG PO TBDP: 75.0000 mg | ORAL_TABLET | Freq: Every day | ORAL | 3 refills | Status: DC | PRN

## 2020-05-29 NOTE — Patient Instructions (Addendum)
This about a neurology referral   Try nurtec and the anti-nausea medication for migraines.      Migraine Headache A migraine headache is an intense, throbbing pain on one side or both sides of the head. Migraine headaches may also cause other symptoms, such as nausea, vomiting, and sensitivity to light and noise. A migraine headache can last from 4 hours to 3 days. Talk with your doctor about what things may bring on (trigger) your migraine headaches. What are the causes? The exact cause of this condition is not known. However, a migraine may be caused when nerves in the brain become irritated and release chemicals that cause inflammation of blood vessels. This inflammation causes pain. This condition may be triggered or caused by:  Drinking alcohol.  Smoking.  Taking medicines, such as: ? Medicine used to treat chest pain (nitroglycerin). ? Birth control pills. ? Estrogen. ? Certain blood pressure medicines.  Eating or drinking products that contain nitrates, glutamate, aspartame, or tyramine. Aged cheeses, chocolate, or caffeine may also be triggers.  Doing physical activity. Other things that may trigger a migraine headache include:  Menstruation.  Pregnancy.  Hunger.  Stress.  Lack of sleep or too much sleep.  Weather changes.  Fatigue. What increases the risk? The following factors may make you more likely to experience migraine headaches:  Being a certain age. This condition is more common in people who are 50-16 years old.  Being female.  Having a family history of migraine headaches.  Being Caucasian.  Having a mental health condition, such as depression or anxiety.  Being obese. What are the signs or symptoms? The main symptom of this condition is pulsating or throbbing pain. This pain may:  Happen in any area of the head, such as on one side or both sides.  Interfere with daily activities.  Get worse with physical activity.  Get worse with  exposure to bright lights or loud noises. Other symptoms may include:  Nausea.  Vomiting.  Dizziness.  General sensitivity to bright lights, loud noises, or smells. Before you get a migraine headache, you may get warning signs (an aura). An aura may include:  Seeing flashing lights or having blind spots.  Seeing bright spots, halos, or zigzag lines.  Having tunnel vision or blurred vision.  Having numbness or a tingling feeling.  Having trouble talking.  Having muscle weakness. Some people have symptoms after a migraine headache (postdromal phase), such as:  Feeling tired.  Difficulty concentrating. How is this diagnosed? A migraine headache can be diagnosed based on:  Your symptoms.  A physical exam.  Tests, such as: ? CT scan or an MRI of the head. These imaging tests can help rule out other causes of headaches. ? Taking fluid from the spine (lumbar puncture) and analyzing it (cerebrospinal fluid analysis, or CSF analysis). How is this treated? This condition may be treated with medicines that:  Relieve pain.  Relieve nausea.  Prevent migraine headaches. Treatment for this condition may also include:  Acupuncture.  Lifestyle changes like avoiding foods that trigger migraine headaches.  Biofeedback.  Cognitive behavioral therapy. Follow these instructions at home: Medicines  Take over-the-counter and prescription medicines only as told by your health care provider.  Ask your health care provider if the medicine prescribed to you: ? Requires you to avoid driving or using heavy machinery. ? Can cause constipation. You may need to take these actions to prevent or treat constipation:  Drink enough fluid to keep your urine pale yellow.  Take over-the-counter or prescription medicines.  Eat foods that are high in fiber, such as beans, whole grains, and fresh fruits and vegetables.  Limit foods that are high in fat and processed sugars, such as fried or  sweet foods. Lifestyle  Do not drink alcohol.  Do not use any products that contain nicotine or tobacco, such as cigarettes, e-cigarettes, and chewing tobacco. If you need help quitting, ask your health care provider.  Get at least 8 hours of sleep every night.  Find ways to manage stress, such as meditation, deep breathing, or yoga. General instructions  Keep a journal to find out what may trigger your migraine headaches. For example, write down: ? What you eat and drink. ? How much sleep you get. ? Any change to your diet or medicines.  If you have a migraine headache: ? Avoid things that make your symptoms worse, such as bright lights. ? It may help to lie down in a dark, quiet room. ? Do not drive or use heavy machinery. ? Ask your health care provider what activities are safe for you while you are experiencing symptoms.  Keep all follow-up visits as told by your health care provider. This is important.      Contact a health care provider if:  You develop symptoms that are different or more severe than your usual migraine headache symptoms.  You have more than 15 headache days in one month. Get help right away if:  Your migraine headache becomes severe.  Your migraine headache lasts longer than 72 hours.  You have a fever.  You have a stiff neck.  You have vision loss.  Your muscles feel weak or like you cannot control them.  You start to lose your balance often.  You have trouble walking.  You faint.  You have a seizure. Summary  A migraine headache is an intense, throbbing pain on one side or both sides of the head. Migraines may also cause other symptoms, such as nausea, vomiting, and sensitivity to light and noise.  This condition may be treated with medicines and lifestyle changes. You may also need to avoid certain things that trigger a migraine headache.  Keep a journal to find out what may trigger your migraine headaches.  Contact your health  care provider if you have more than 15 headache days in a month or you develop symptoms that are different or more severe than your usual migraine headache symptoms. This information is not intended to replace advice given to you by your health care provider. Make sure you discuss any questions you have with your health care provider. Document Revised: 06/03/2018 Document Reviewed: 03/24/2018 Elsevier Patient Education  [place].

## 2020-05-29 NOTE — Assessment & Plan Note (Signed)
Chronic Has intermittent migraines Typically lays down and takes ibuprofen

## 2020-05-29 NOTE — Assessment & Plan Note (Signed)
New I think what she experienced was a complicated migraine.  She does have history of her migraines, but has never experienced anything like this before Discussed that this is unlikely to be a TIA or stroke because of the distribution of her symptoms Discussed that this can happen again Discussed possible referral to neurology-she deferred at this time Neuro exam is nonfocal Discussed migraine medications that she can take for her regular migraines and if something like this was to happen again and she would like to have them on hand Zofran 4 mg ODT as needed, Nurtec 75 mg daily as needed for migraine she was given a savings card for this She will call with questions or concerns and let me know if she would like to eventually see a neurologist

## 2020-06-13 ENCOUNTER — Telehealth: Payer: Self-pay

## 2020-06-13 NOTE — Telephone Encounter (Signed)
PA CBI:[id number]  Waiting for response from plan.

## 2020-06-25 ENCOUNTER — Telehealth: Payer: Self-pay

## 2020-06-25 DIAGNOSIS — G43109 Migraine with aura, not intractable, without status migrainosus: Secondary | ICD-10-CM

## 2020-06-25 NOTE — Telephone Encounter (Signed)
Sent her a Pharmacist, community message  - will see if she wants to try a triptan.

## 2020-06-25 NOTE — Telephone Encounter (Signed)
PA [id number]  PA was denied for Nurtec 75MG  dispersible tablets.

## 2020-07-02 ENCOUNTER — Encounter: Payer: Self-pay | Admitting: Internal Medicine

## 2020-07-17 NOTE — Addendum Note (Signed)
Addended by: Binnie Rail on: 07/17/2020 12:10 PM   Modules accepted: Orders

## 2020-07-17 NOTE — Addendum Note (Signed)
Addended by: Binnie Rail on: 07/17/2020 12:08 PM   Modules accepted: Orders

## 2020-07-18 ENCOUNTER — Ambulatory Visit: Payer: 59

## 2020-10-22 ENCOUNTER — Ambulatory Visit (HOSPITAL_BASED_OUTPATIENT_CLINIC_OR_DEPARTMENT_OTHER): Payer: 59 | Admitting: Obstetrics & Gynecology

## 2020-10-23 ENCOUNTER — Ambulatory Visit (INDEPENDENT_AMBULATORY_CARE_PROVIDER_SITE_OTHER): Payer: 59 | Admitting: Obstetrics & Gynecology

## 2020-10-23 ENCOUNTER — Encounter (HOSPITAL_BASED_OUTPATIENT_CLINIC_OR_DEPARTMENT_OTHER): Payer: Self-pay | Admitting: Obstetrics & Gynecology

## 2020-10-23 ENCOUNTER — Other Ambulatory Visit (HOSPITAL_COMMUNITY)
Admission: RE | Admit: 2020-10-23 | Discharge: 2020-10-23 | Disposition: A | Discharge status: Home / Self Care | Payer: 59 | Source: Ambulatory Visit | Attending: Obstetrics & Gynecology | Admitting: Obstetrics & Gynecology

## 2020-10-23 ENCOUNTER — Other Ambulatory Visit: Payer: Self-pay

## 2020-10-23 VITALS — BP 137/83 | HR 78 | Ht 62.0 in | Wt 145.6 lb

## 2020-10-23 DIAGNOSIS — Z1211 Encounter for screening for malignant neoplasm of colon: Secondary | ICD-10-CM

## 2020-10-23 DIAGNOSIS — Z780 Asymptomatic menopausal state: Secondary | ICD-10-CM

## 2020-10-23 DIAGNOSIS — Z01419 Encounter for gynecological examination (general) (routine) without abnormal findings: Secondary | ICD-10-CM | POA: Diagnosis not present

## 2020-10-23 DIAGNOSIS — Z124 Encounter for screening for malignant neoplasm of cervix: Secondary | ICD-10-CM | POA: Diagnosis present

## 2020-10-23 DIAGNOSIS — Z8659 Personal history of other mental and behavioral disorders: Secondary | ICD-10-CM

## 2020-10-23 DIAGNOSIS — L900 Lichen sclerosus et atrophicus: Secondary | ICD-10-CM

## 2020-10-23 DIAGNOSIS — Z1231 Encounter for screening mammogram for malignant neoplasm of breast: Secondary | ICD-10-CM | POA: Diagnosis not present

## 2020-10-23 MED ORDER — DESVENLAFAXINE SUCCINATE ER 100 MG PO TB24: 100.0000 mg | ORAL_TABLET | Freq: Every day | ORAL | 4 refills | Status: DC

## 2020-10-23 MED ORDER — TRIAMCINOLONE ACETONIDE 0.5 % EX OINT: 1.0000 "application " | TOPICAL_OINTMENT | Freq: Every day | CUTANEOUS | 0 refills | Status: DC

## 2020-10-23 NOTE — Progress Notes (Signed)
60 y.o. [id number] Married White or Caucasian female here for annual exam.  Doing well.  Denies vaginal bleeding.  Denies vulvar itching.  Working part time with [employer] in [employer].  Kids are good.  Reports having trouble with motivation and excessive sleep earlier this year.  She is taking Prestiq a '50mg'$  and 100-mg every other day.  She feels better than she did with the increased dosage.  We discussed increasing this to the daily '100mg'$  dosage.    Patient's last menstrual period was 02/23/2009.          Sexually active: Yes.    The current method of family planning is post menopausal status.    Exercising: Yes.     Smoker:  no  Health Maintenance: Pap:  2019 History of abnormal Pap:  no MMG:  09/07/2019 Colonoscopy:  2012 BMD:   2020 TDaP:  2015 Pneumonia vaccine(s):  not indicated Shingrix:   completed Hep C testing: 2018 Screening Labs: 11/2019   reports that she has never smoked. She has never used smokeless tobacco. She reports current alcohol use of about 7.0 standard drinks per week. She reports that she does not use drugs.  Past Medical History:  Diagnosis Date   Anxiety    Depression    anxiety   Insomnia    Kidney stones    multiple-4/16   SUI (stress urinary incontinence, female)     Past Surgical History:  Procedure Laterality Date   CYSTOSCOPY/RETROGRADE/URETEROSCOPY/STONE EXTRACTION WITH BASKET  4/16   Dr. Jonette Eva   FOOT FRACTURE SURGERY Right 2019   FOOT SURGERY  2005   right foot   LAPAROSCOPIC APPENDECTOMY  2006   LITHOTRIPSY  4/16   Dr. Jonette Eva   [facility]  2/11   Dr Matilde Sprang     Current Outpatient Medications  Medication Sig Dispense Refill   desvenlafaxine (PRISTIQ) 100 MG 24 hr tablet Take 1 tablet (100 mg total) by mouth daily. 90 tablet 4   fluticasone (FLONASE) 50 MCG/ACT nasal spray daily as needed.     ibuprofen (ADVIL,MOTRIN) 600 MG tablet Take 1 tablet (600 mg total) by mouth every 6 (six) hours as needed. 30 tablet  0   indapamide (LOZOL) 1.25 MG tablet Take 1 tablet (1.25 mg total) by mouth daily. 90 tablet 2   meloxicam (MOBIC) 7.5 MG tablet Take 1 tablet (7.5 mg total) by mouth daily. 90 tablet 2   Nutritional Supplements (JUICE PLUS FIBRE PO) Take by mouth.     ondansetron (ZOFRAN ODT) 4 MG disintegrating tablet Take 1 tablet (4 mg total) by mouth every 8 (eight) hours as needed for nausea or vomiting. 5 tablet 0   Potassium Citrate 15 MEQ (1620 MG) TBCR Take 1 tablet by mouth 2 (two) times daily with a meal. 180 tablet 2   Rimegepant Sulfate (NURTEC) 75 MG TBDP Take 75 mg by mouth daily as needed for up to 1 dose (migraine). MDD 1 8 tablet 3   Magnesium 400 MG CAPS Take by mouth. (Patient not taking: Reported on 10/23/2020)     No current facility-administered medications for this visit.    Family History  Problem Relation Age of Onset   Hypertension Mother    Thyroid disease Mother    Lung cancer Mother    Throat cancer Mother    Diabetes Paternal Grandfather    Heart disease Father    Heart disease Brother    Kidney cancer Brother    Diabetes Other  Prostate cancer Brother     Review of Systems  All other systems reviewed and are negative.  Exam:   BP 137/83 (BP Location: Right Arm, Patient Position: Sitting, Cuff Size: Small)   Pulse 78   Ht '5\' 2"'$  (1.575 m)   Wt 145 lb 9.6 oz (66 kg)   LMP 02/23/2009   BMI 26.63 kg/m   Height: '5\' 2"'$  (157.5 cm)  wt loss: -36#  General appearance: alert, cooperative and appears stated age Head: Normocephalic, without obvious abnormality, atraumatic Neck: no adenopathy, supple, symmetrical, trachea midline and thyroid normal to inspection and palpation Lungs: clear to auscultation bilaterally Breasts: normal appearance, no masses or tenderness Heart: regular rate and rhythm Abdomen: soft, non-tender; bowel sounds normal; no masses,  no organomegaly Extremities: extremities normal, atraumatic, no cyanosis or edema Skin: Skin color, texture,  turgor normal. No rashes or lesions Lymph nodes: Cervical, supraclavicular, and axillary nodes normal. No abnormal inguinal nodes palpated Neurologic: Grossly normal   Pelvic: External genitalia:  no lesions, no hypopigmentation              Urethra:  normal appearing urethra with no masses, tenderness or lesions              Bartholins and Skenes: normal                 Vagina: normal appearing vagina with normal color and no discharge, no lesions              Cervix: no lesions              Pap taken: Yes.   Bimanual Exam:  Uterus:  normal size, contour, position, consistency, mobility, non-tender              Adnexa: normal adnexa and no mass, fullness, tenderness               Rectovaginal: Confirms               Anus:  normal sphincter tone, no lesions  Chaperone, Octaviano Batty, CMA, was present for exam.  Assessment/Plan: 1. Encounter for well woman exam with routine gynecological exam - pap smear obtained today - MMG due.  Order placed. - colonoscopy due.  Referral placed. - lab work done 11/2019.  None needed today. - vaccines reviewed/updated - BMD done  2020  2. Postmenopausal - no HRT  3. History of depression - increasing dosage to '100mg'$  - desvenlafaxine (PRISTIQ) 100 MG 24 hr tablet; Take 1 tablet (100 mg total) by mouth daily.  Dispense: 90 tablet; Refill: 4  4. Lichen sclerosus - no abnormal skin changes.  Will updated rx. - triamcinolone ointment (KENALOG) 0.5 %; Apply 1 application topically at bedtime.  Dispense: 30 g; Refill: 0

## 2020-10-25 DIAGNOSIS — Z8659 Personal history of other mental and behavioral disorders: Secondary | ICD-10-CM | POA: Insufficient documentation

## 2020-10-25 DIAGNOSIS — Z780 Asymptomatic menopausal state: Secondary | ICD-10-CM | POA: Insufficient documentation

## 2020-10-25 LAB — CYTOLOGY - PAP
Comment: NEGATIVE
Diagnosis: NEGATIVE
High risk HPV: NEGATIVE

## 2020-11-01 ENCOUNTER — Encounter (HOSPITAL_BASED_OUTPATIENT_CLINIC_OR_DEPARTMENT_OTHER): Payer: Self-pay

## 2020-11-06 ENCOUNTER — Other Ambulatory Visit: Payer: Self-pay

## 2020-11-06 ENCOUNTER — Encounter: Payer: Self-pay | Admitting: Gastroenterology

## 2020-11-06 ENCOUNTER — Ambulatory Visit (HOSPITAL_BASED_OUTPATIENT_CLINIC_OR_DEPARTMENT_OTHER)
Admission: RE | Admit: 2020-11-06 | Discharge: 2020-11-06 | Disposition: A | Discharge status: Home / Self Care | Payer: 59 | Source: Ambulatory Visit | Attending: Obstetrics & Gynecology | Admitting: Obstetrics & Gynecology

## 2020-11-06 ENCOUNTER — Encounter: Payer: Self-pay | Admitting: Internal Medicine

## 2020-11-06 DIAGNOSIS — Z1231 Encounter for screening mammogram for malignant neoplasm of breast: Secondary | ICD-10-CM | POA: Insufficient documentation

## 2020-11-07 ENCOUNTER — Encounter: Payer: Self-pay | Admitting: Gastroenterology

## 2020-11-07 MED ORDER — MELOXICAM 7.5 MG PO TABS: 7.5000 mg | ORAL_TABLET | Freq: Every day | ORAL | 0 refills | Status: DC

## 2020-11-18 ENCOUNTER — Ambulatory Visit (AMBULATORY_SURGERY_CENTER): Payer: 59 | Admitting: *Deleted

## 2020-11-18 ENCOUNTER — Other Ambulatory Visit: Payer: Self-pay

## 2020-11-18 VITALS — Ht 62.0 in | Wt 145.0 lb

## 2020-11-18 DIAGNOSIS — Z1211 Encounter for screening for malignant neoplasm of colon: Secondary | ICD-10-CM

## 2020-11-18 MED ORDER — NA SULFATE-K SULFATE-MG SULF 17.5-3.13-1.6 GM/177ML PO SOLN: 1.0000 | ORAL | 0 refills | Status: DC

## 2020-11-18 NOTE — Progress Notes (Signed)

## 2020-12-03 ENCOUNTER — Encounter: Payer: Self-pay | Admitting: Internal Medicine

## 2020-12-03 MED ORDER — INDAPAMIDE 1.25 MG PO TABS: 1.2500 mg | ORAL_TABLET | Freq: Every day | ORAL | 0 refills | Status: DC

## 2020-12-05 ENCOUNTER — Other Ambulatory Visit (HOSPITAL_COMMUNITY)
Admission: RE | Admit: 2020-12-05 | Discharge: 2020-12-05 | Disposition: A | Discharge status: Home / Self Care | Payer: 59 | Source: Ambulatory Visit | Attending: Obstetrics & Gynecology | Admitting: Obstetrics & Gynecology

## 2020-12-05 ENCOUNTER — Other Ambulatory Visit: Payer: Self-pay

## 2020-12-05 ENCOUNTER — Encounter (HOSPITAL_BASED_OUTPATIENT_CLINIC_OR_DEPARTMENT_OTHER): Payer: Self-pay | Admitting: Obstetrics & Gynecology

## 2020-12-05 ENCOUNTER — Ambulatory Visit (INDEPENDENT_AMBULATORY_CARE_PROVIDER_SITE_OTHER): Payer: 59 | Admitting: Obstetrics & Gynecology

## 2020-12-05 VITALS — BP 123/90 | HR 81 | Ht 62.0 in | Wt 146.2 lb

## 2020-12-05 DIAGNOSIS — N9089 Other specified noninflammatory disorders of vulva and perineum: Secondary | ICD-10-CM | POA: Insufficient documentation

## 2020-12-05 DIAGNOSIS — L900 Lichen sclerosus et atrophicus: Secondary | ICD-10-CM

## 2020-12-05 DIAGNOSIS — Z23 Encounter for immunization: Secondary | ICD-10-CM | POA: Diagnosis not present

## 2020-12-05 MED ORDER — MOMETASONE FUROATE 0.1 % EX OINT: TOPICAL_OINTMENT | Freq: Every day | CUTANEOUS | 2 refills | Status: DC

## 2020-12-05 NOTE — Patient Instructions (Signed)
Continue to use the steroid ointment nightly for the next 4 weeks.  Then, you will switch to mometasone ointment and use this twice weekly as maintenance.

## 2020-12-06 ENCOUNTER — Encounter: Payer: 59 | Admitting: Gastroenterology

## 2020-12-07 NOTE — Progress Notes (Signed)
GYNECOLOGY  VISIT  CC:   vulvar recheck  HPI: 60 y.o. G3P0012 Married White or Caucasian female here for vulvar recheck.  Has skin changes visually consistent with lichen sclerosus at AEX.  Pt has been using topical steroid nightly.  Denies any itching but never really had any.  Denies any bleeding.  I do really think this is lichen sclerosus but want to r/o any dysplasia.  Short and long term treatment of lichen sclerosus discussed including transitioning to lower potency steroid.  GYNECOLOGIC HISTORY: Patient's last menstrual period was 02/23/2009.   Patient Active Problem List   Diagnosis Date Noted   History of depression 10/25/2020   Postmenopausal 24/40/1027   Complicated migraine 25/36/6440   Migraine headache 05/29/2020   Elevated LFTs 01/22/2020   Hyperglycemia 12/13/2019   Sleep difficulties 12/13/2019   Adjustment disorder with mixed anxiety and depressed mood 12/13/2019   Fatty liver, [place] 11/2018 12/15/2018   Cholelithiasis 12/15/2018   Foot pain, bilateral 34/74/2595   Colicky RUQ abdominal pain 63/87/5643   Lichen sclerosus 32/95/1884   Depression 01/13/2015   Generalized anxiety disorder 01/13/2015   Nephrolithiasis 06/20/2014   Family history of heart attack 01/15/2012    Past Medical History:  Diagnosis Date   Anxiety    Depression    anxiety   Insomnia    Kidney stones    multiple-4/16   Post-operative nausea and vomiting    SUI (stress urinary incontinence, female)     Past Surgical History:  Procedure Laterality Date   COLONOSCOPY     10 years ago in [place]-normal exam   CYSTOSCOPY/RETROGRADE/URETEROSCOPY/STONE EXTRACTION WITH BASKET  05/2014   Dr. Jonette Eva   FOOT FRACTURE SURGERY Right 2019   FOOT SURGERY  2005   right foot   LAPAROSCOPIC APPENDECTOMY  2006   LITHOTRIPSY  05/2014   Dr. Jonette Eva   URETHRAL SLING  03/2009   Dr Matilde Sprang     MEDS:   Current Outpatient Medications on File Prior to Visit  Medication Sig  Dispense Refill   desvenlafaxine (PRISTIQ) 100 MG 24 hr tablet Take 1 tablet (100 mg total) by mouth daily. 90 tablet 4   fluticasone (FLONASE) 50 MCG/ACT nasal spray daily as needed.     ibuprofen (ADVIL,MOTRIN) 600 MG tablet Take 1 tablet (600 mg total) by mouth every 6 (six) hours as needed. 30 tablet 0   indapamide (LOZOL) 1.25 MG tablet Take 1 tablet (1.25 mg total) by mouth daily. 30 tablet 0   meloxicam (MOBIC) 7.5 MG tablet Take 1 tablet (7.5 mg total) by mouth daily. 90 tablet 0   Nutritional Supplements (JUICE PLUS FIBRE PO) Take by mouth.     ondansetron (ZOFRAN ODT) 4 MG disintegrating tablet Take 1 tablet (4 mg total) by mouth every 8 (eight) hours as needed for nausea or vomiting. 5 tablet 0   Potassium Citrate 15 MEQ (1620 MG) TBCR Take 1 tablet by mouth 2 (two) times daily with a meal. 180 tablet 2   Rimegepant Sulfate (NURTEC) 75 MG TBDP Take 75 mg by mouth daily as needed for up to 1 dose (migraine). MDD 1 8 tablet 3   triamcinolone ointment (KENALOG) 0.5 % Apply 1 application topically at bedtime. 30 g 0   Na Sulfate-K Sulfate-Mg Sulf 17.5-3.13-1.6 GM/177ML SOLN Take 1 kit by mouth as directed. May use generic Suprep (Patient not taking: Reported on 12/05/2020) 354 mL 0   No current facility-administered medications on file prior to visit.  ALLERGIES: Amoxicillin-pot clavulanate, Codeine, Hydrocodone, and Sulfa antibiotics  Family History  Problem Relation Age of Onset   Hypertension Mother    Thyroid disease Mother    Lung cancer Mother    Throat cancer Mother    Heart disease Father    Heart disease Brother    Kidney cancer Brother    Prostate cancer Brother    Diabetes Paternal Grandfather    Diabetes Other    Colon cancer Neg Hx    Colon polyps Neg Hx    Esophageal cancer Neg Hx    Stomach cancer Neg Hx    Rectal cancer Neg Hx     SH:  married, non smoker  Review of Systems  Constitutional: Negative.   Genitourinary: Negative.    PHYSICAL  EXAMINATION:    BP 123/90   Pulse 81   Ht $R'5\' 2"'ma$  (1.575 m)   Wt 146 lb 3.2 oz (66.3 kg)   LMP 02/23/2009   BMI 26.74 kg/m     General appearance: alert, cooperative and appears stated age Lymph:  no inguinal LAD noted  Pelvic: External genitalia:  hypopigmentation of inner labia majora from clitoris down to perineal body noted, less hypopigmentation present, flattened labia minora noted, small area of thickening noted on inner left labia majora (possible where labia minora is scarred), feel biopsy should be obtained here              Urethra:  normal appearing urethra with no masses, tenderness or lesions              Bartholins and Skenes: normal                  Procedure:  Area cleansed with Betadine.  Sterile technique used throughout procedure.  Skin anesthestized with Lidocaine 1% plain; 1.4mL.  1mm punch biopsy used to obtain specimen.  Biopsy grasped with pick-ups and excised with scissors.  Adequate hemostasis obtained with silver nitrate sticks.  Dressing was not applied.  Pt tolerated procedure well.  Chaperone, Ezekiel Ina, RN, was present for exam.  Assessment/Plan: 1. Vulvar lesion c/w lichen sclerosus - advised pt to continue using nightly steroid for at least one more month - if biopsy showed lichen sclerosus, will then proceed with transitioning to mometasone ointment 0.1% twice weekly for maintenance - Surgical pathology( [place])

## 2020-12-10 LAB — SURGICAL PATHOLOGY

## 2021-01-10 ENCOUNTER — Encounter (HOSPITAL_BASED_OUTPATIENT_CLINIC_OR_DEPARTMENT_OTHER): Payer: Self-pay | Admitting: Obstetrics & Gynecology

## 2021-01-13 ENCOUNTER — Other Ambulatory Visit (HOSPITAL_BASED_OUTPATIENT_CLINIC_OR_DEPARTMENT_OTHER): Payer: Self-pay | Admitting: Obstetrics & Gynecology

## 2021-01-13 MED ORDER — INDAPAMIDE 1.25 MG PO TABS: 1.2500 mg | ORAL_TABLET | Freq: Every day | ORAL | 0 refills | Status: DC

## 2021-01-27 ENCOUNTER — Encounter: Payer: 59 | Admitting: Internal Medicine

## 2021-01-29 ENCOUNTER — Encounter: Payer: 59 | Admitting: Internal Medicine

## 2021-01-31 ENCOUNTER — Encounter: Payer: 59 | Admitting: Gastroenterology

## 2021-02-04 NOTE — Progress Notes (Signed)
Subjective:    Patient ID: Joy Petty, female    DOB: 09-10-1960, 60 y.o.   MRN: [id number]   This visit occurred during the SARS-CoV-2 public health emergency.  Safety protocols were in place, including screening questions prior to the visit, additional usage of staff PPE, and extensive cleaning of exam room while observing appropriate contact time as indicated for disinfecting solutions.    HPI She is here for a physical exam.   She has had a sore throat for about one month.  It kind of feels like mono.  She feels tired.  She denies swollen lymph nodes.  It feels like it has gotten worse.  It is the whole throat.  No sick contacts.  She will take tylenol or ibuprofen and it helps temporarily .    She does snore and has a snore guard that is worn out and she can get a new one next year.  She does have moderate sleep apnea.  Medications and allergies reviewed with patient and updated if appropriate.  Patient Active Problem List   Diagnosis Date Noted   Snoring 02/05/2021   Postmenopausal 61/44/3154   Complicated migraine 00/86/7619   Migraine headache 05/29/2020   Elevated LFTs 01/22/2020   Hyperglycemia 12/13/2019   Sleep difficulties 12/13/2019   Fatty liver, [place] 11/2018 12/15/2018   Cholelithiasis 12/15/2018   Foot pain, bilateral 50/93/2671   Colicky RUQ abdominal pain 24/58/0998   Lichen sclerosus 33/82/5053   Depression 01/13/2015   Generalized anxiety disorder 01/13/2015   Nephrolithiasis 06/20/2014   Family history of heart attack 01/15/2012    Current Outpatient Medications on File Prior to Visit  Medication Sig Dispense Refill   desvenlafaxine (PRISTIQ) 100 MG 24 hr tablet Take 1 tablet (100 mg total) by mouth daily. 90 tablet 4   fluticasone (FLONASE) 50 MCG/ACT nasal spray daily as needed.     ibuprofen (ADVIL,MOTRIN) 600 MG tablet Take 1 tablet (600 mg total) by mouth every 6 (six) hours as needed. 30 tablet 0   indapamide (LOZOL) 1.25 MG  tablet Take 1 tablet (1.25 mg total) by mouth daily. 90 tablet 0   mometasone (ELOCON) 0.1 % ointment Apply topically daily. 45 g 2   Nutritional Supplements (JUICE PLUS FIBRE PO) Take by mouth.     Potassium Citrate 15 MEQ (1620 MG) TBCR Take 1 tablet by mouth 2 (two) times daily with a meal. 180 tablet 2   Rimegepant Sulfate (NURTEC) 75 MG TBDP Take 75 mg by mouth daily as needed for up to 1 dose (migraine). MDD 1 8 tablet 3   triamcinolone ointment (KENALOG) 0.5 % Apply 1 application topically at bedtime. 30 g 0   No current facility-administered medications on file prior to visit.    Past Medical History:  Diagnosis Date   Anxiety    Depression    anxiety   Insomnia    Kidney stones    multiple-4/16   Post-operative nausea and vomiting    SUI (stress urinary incontinence, female)     Past Surgical History:  Procedure Laterality Date   COLONOSCOPY     10 years ago in [place], [place]normal exam   CYSTOSCOPY/RETROGRADE/URETEROSCOPY/STONE EXTRACTION WITH BASKET  05/2014   Dr. Jonette Eva   FOOT FRACTURE SURGERY Right 2019   FOOT SURGERY  2005   right foot   LAPAROSCOPIC APPENDECTOMY  2006   LITHOTRIPSY  05/2014   Dr. Jonette Eva   [facility]  03/2009   Dr Matilde Sprang  Social History   Socioeconomic History   Marital status: Married    Spouse name: Not on file   Number of children: Not on file   Years of education: Not on file   Highest education level: Not on file  Occupational History   Not on file  Tobacco Use   Smoking status: Never   Smokeless tobacco: Never  Vaping Use   Vaping Use: Never used  Substance and Sexual Activity   Alcohol use: Yes    Alcohol/week: 7.0 standard drinks    Types: 7 Standard drinks or equivalent per week   Drug use: No   Sexual activity: Yes    Partners: Male    Birth control/protection: Post-menopausal  Other Topics Concern   Not on file  Social History Narrative   Not on file   Social Determinants of Health    Financial Resource Strain: Not on file  Food Insecurity: Not on file  Transportation Needs: Not on file  Physical Activity: Not on file  Stress: Not on file  Social Connections: Not on file    Family History  Problem Relation Age of Onset   Hypertension Mother    Thyroid disease Mother    Lung cancer Mother    Throat cancer Mother    Heart disease Father    Heart disease Brother    Kidney cancer Brother    Prostate cancer Brother    Diabetes Paternal Grandfather    Diabetes Other    Colon cancer Neg Hx    Colon polyps Neg Hx    Esophageal cancer Neg Hx    Stomach cancer Neg Hx    Rectal cancer Neg Hx     Review of Systems  Constitutional:  Negative for appetite change, chills, fever and unexpected weight change.  HENT:  Positive for rhinorrhea (new) and sore throat. Negative for congestion, ear pain, sinus pressure, sinus pain and trouble swallowing.   Respiratory:  Negative for cough, shortness of breath and wheezing.   Cardiovascular:  Negative for chest pain, palpitations and leg swelling.  Gastrointestinal:  Negative for abdominal pain, blood in stool, constipation, diarrhea and nausea.       No gerd  Genitourinary:  Negative for dysuria.  Musculoskeletal:  Positive for arthralgias. Negative for back pain.  Skin:  Negative for color change and rash.  Neurological:  Positive for headaches (migraines, weather related). Negative for dizziness and light-headedness.  Psychiatric/Behavioral:  Positive for dysphoric mood. The patient is nervous/anxious.       Objective:   Vitals:   02/05/21 1338  BP: 130/84  Pulse: 85  Temp: 98 F (36.7 C)  SpO2: 97%   Filed Weights   02/05/21 1338  Weight: 148 lb 6.4 oz (67.3 kg)   Body mass index is 27.14 kg/m.  BP Readings from Last 3 Encounters:  02/05/21 130/84  12/05/20 123/90  10/23/20 137/83    Wt Readings from Last 3 Encounters:  02/05/21 148 lb 6.4 oz (67.3 kg)  12/05/20 146 lb 3.2 oz (66.3 kg)  11/18/20  145 lb (65.8 kg)    Depression screen [facility] 2/9 10/23/2020 05/29/2020  Decreased Interest 0 0  Down, Depressed, Hopeless 0 0  PHQ - 2 Score 0 0     No flowsheet data found.     Physical Exam Constitutional: She appears well-developed and well-nourished. No distress.  HENT:  Head: Normocephalic and atraumatic.  Right Ear: External ear normal. Normal ear canal and TM Left Ear: External ear normal.  Normal  ear canal and TM Mouth/Throat: Oropharynx is clear and moist.  Eyes: Conjunctivae and EOM are normal.  Neck: Neck supple. No tracheal deviation present. No thyromegaly present.  No carotid bruit  Cardiovascular: Normal rate, regular rhythm and normal heart sounds.   No murmur heard.  No edema. Pulmonary/Chest: Effort normal and breath sounds normal. No respiratory distress. She has no wheezes. She has no rales.  Breast: deferred   Abdominal: Soft. She exhibits no distension. There is no tenderness.  Lymphadenopathy: She has no cervical adenopathy.  Skin: Skin is warm and dry. She is not diaphoretic.  Psychiatric: She has a normal mood and affect. Her behavior is normal.     Lab Results  Component Value Date   WBC 4.4 12/14/2019   HGB 13.8 12/14/2019   HCT 39.5 12/14/2019   PLT 209.0 12/14/2019   GLUCOSE 93 12/14/2019   CHOL 160 12/14/2019   TRIG 88.0 12/14/2019   HDL 46.90 12/14/2019   LDLCALC 95 12/14/2019   ALT 65 (H) 12/14/2019   AST 49 (H) 12/14/2019   NA 137 12/14/2019   K 3.7 12/14/2019   CL 98 12/14/2019   CREATININE 0.65 12/14/2019   BUN 24 (H) 12/14/2019   CO2 28 12/14/2019   TSH 2.73 12/14/2019   INR 0.94 03/28/2009   HGBA1C 5.4 12/14/2019         Assessment & Plan:   Physical exam: Screening blood work  ordered Exercise  walking Weight  encouraged Substance abuse  none   Reviewed recommended immunizations.   Health Maintenance  Topic Date Due   COLONOSCOPY (Pts 45-59yrs Insurance coverage will need to be confirmed)  05/21/2020    COVID-19 Vaccine (3 - Booster for Janssen series) 02/21/2021 (Originally 04/19/2020)   HIV Screening  12/12/2053 (Originally 11/08/1975)   MAMMOGRAM  11/07/2022   PAP SMEAR-Modifier  10/24/2023   TETANUS/TDAP  10/24/2023   INFLUENZA VACCINE  Completed   Hepatitis C Screening  Completed   Zoster Vaccines- Shingrix  Completed   Pneumococcal Vaccine 53-71 Years old  Aged Out   HPV VACCINES  Aged Out          See Problem List for Assessment and Plan of chronic medical problems.

## 2021-02-04 NOTE — Patient Instructions (Addendum)
Blood work was ordered.     Medications changes include :   zomig 5 mg for migraines  Your prescription(s) have been submitted to your pharmacy. Please take as directed and contact our office if you believe you are having problem(s) with the medication(s).   Please followup in 1 year, sooner if needed   Health Maintenance, Female Adopting a healthy lifestyle and getting preventive care are important in promoting health and wellness. Ask your health care provider about: The right schedule for you to have regular tests and exams. Things you can do on your own to prevent diseases and keep yourself healthy. What should I know about diet, weight, and exercise? Eat a healthy diet  Eat a diet that includes plenty of vegetables, fruits, low-fat dairy products, and lean protein. Do not eat a lot of foods that are high in solid fats, added sugars, or sodium. Maintain a healthy weight Body mass index (BMI) is used to identify weight problems. It estimates body fat based on height and weight. Your health care provider can help determine your BMI and help you achieve or maintain a healthy weight. Get regular exercise Get regular exercise. This is one of the most important things you can do for your health. Most adults should: Exercise for at least 150 minutes each week. The exercise should increase your heart rate and make you sweat (moderate-intensity exercise). Do strengthening exercises at least twice a week. This is in addition to the moderate-intensity exercise. Spend less time sitting. Even light physical activity can be beneficial. Watch cholesterol and blood lipids Have your blood tested for lipids and cholesterol at 62 years of age, then have this test every 5 years. Have your cholesterol levels checked more often if: Your lipid or cholesterol levels are high. You are older than 62 years of age. You are at high risk for heart disease. What should I know about cancer  screening? Depending on your health history and family history, you may need to have cancer screening at various ages. This may include screening for: Breast cancer. Cervical cancer. Colorectal cancer. Skin cancer. Lung cancer. What should I know about heart disease, diabetes, and high blood pressure? Blood pressure and heart disease High blood pressure causes heart disease and increases the risk of stroke. This is more likely to develop in people who have high blood pressure readings or are overweight. Have your blood pressure checked: Every 3-5 years if you are 89-39 years of age. Every year if you are 20 years old or older. Diabetes Have regular diabetes screenings. This checks your fasting blood sugar level. Have the screening done: Once every three years after age 59 if you are at a normal weight and have a low risk for diabetes. More often and at a younger age if you are overweight or have a high risk for diabetes. What should I know about preventing infection? Hepatitis B If you have a higher risk for hepatitis B, you should be screened for this virus. Talk with your health care provider to find out if you are at risk for hepatitis B infection. Hepatitis C Testing is recommended for: Everyone born from [birth year] through 1967. Anyone with known risk factors for hepatitis C. Sexually transmitted infections (STIs) Get screened for STIs, including gonorrhea and chlamydia, if: You are sexually active and are younger than 62 years of age. You are older than 62 years of age and your health care provider tells you that you are at risk for this type of  infection. Your sexual activity has changed since you were last screened, and you are at increased risk for chlamydia or gonorrhea. Ask your health care provider if you are at risk. Ask your health care provider about whether you are at high risk for HIV. Your health care provider may recommend a prescription medicine to help prevent HIV  infection. If you choose to take medicine to prevent HIV, you should first get tested for HIV. You should then be tested every 3 months for as long as you are taking the medicine. Pregnancy If you are about to stop having your period (premenopausal) and you may become pregnant, seek counseling before you get pregnant. Take 400 to 800 micrograms (mcg) of folic acid every day if you become pregnant. Ask for birth control (contraception) if you want to prevent pregnancy. Osteoporosis and menopause Osteoporosis is a disease in which the bones lose minerals and strength with aging. This can result in bone fractures. If you are 59 years old or older, or if you are at risk for osteoporosis and fractures, ask your health care provider if you should: Be screened for bone loss. Take a calcium or vitamin D supplement to lower your risk of fractures. Be given hormone replacement therapy (HRT) to treat symptoms of menopause. Follow these instructions at home: Alcohol use Do not drink alcohol if: Your health care provider tells you not to drink. You are pregnant, may be pregnant, or are planning to become pregnant. If you drink alcohol: Limit how much you have to: 0-1 drink a day. Know how much alcohol is in your drink. In the [place] one drink equals one 12 oz bottle of beer (355 mL), one 5 oz glass of wine (148 mL), or one 1 oz glass of hard liquor (44 mL). Lifestyle Do not use any products that contain nicotine or tobacco. These products include cigarettes, chewing tobacco, and vaping devices, such as e-cigarettes. If you need help quitting, ask your health care provider. Do not use street drugs. Do not share needles. Ask your health care provider for help if you need support or information about quitting drugs. General instructions Schedule regular health, dental, and eye exams. Stay current with your vaccines. Tell your health care provider if: You often feel depressed. You have ever been abused  or do not feel safe at home. Summary Adopting a healthy lifestyle and getting preventive care are important in promoting health and wellness. Follow your health care provider's instructions about healthy diet, exercising, and getting tested or screened for diseases. Follow your health care provider's instructions on monitoring your cholesterol and blood pressure. This information is not intended to replace advice given to you by your health care provider. Make sure you discuss any questions you have with your health care provider. Document Revised: 07/01/2020 Document Reviewed: 07/01/2020 Elsevier Patient Education  [employer].

## 2021-02-05 ENCOUNTER — Other Ambulatory Visit: Payer: Self-pay

## 2021-02-05 ENCOUNTER — Encounter: Payer: Self-pay | Admitting: Internal Medicine

## 2021-02-05 ENCOUNTER — Ambulatory Visit (INDEPENDENT_AMBULATORY_CARE_PROVIDER_SITE_OTHER): Payer: 59 | Admitting: Internal Medicine

## 2021-02-05 VITALS — BP 130/84 | HR 85 | Temp 98.0°F | Ht 62.0 in | Wt 148.4 lb

## 2021-02-05 DIAGNOSIS — K760 Fatty (change of) liver, not elsewhere classified: Secondary | ICD-10-CM

## 2021-02-05 DIAGNOSIS — G4730 Sleep apnea, unspecified: Secondary | ICD-10-CM | POA: Insufficient documentation

## 2021-02-05 DIAGNOSIS — F411 Generalized anxiety disorder: Secondary | ICD-10-CM

## 2021-02-05 DIAGNOSIS — F3289 Other specified depressive episodes: Secondary | ICD-10-CM | POA: Diagnosis not present

## 2021-02-05 DIAGNOSIS — M79671 Pain in right foot: Secondary | ICD-10-CM

## 2021-02-05 DIAGNOSIS — R0683 Snoring: Secondary | ICD-10-CM

## 2021-02-05 DIAGNOSIS — Z0000 Encounter for general adult medical examination without abnormal findings: Secondary | ICD-10-CM

## 2021-02-05 DIAGNOSIS — G43909 Migraine, unspecified, not intractable, without status migrainosus: Secondary | ICD-10-CM

## 2021-02-05 DIAGNOSIS — R739 Hyperglycemia, unspecified: Secondary | ICD-10-CM | POA: Diagnosis not present

## 2021-02-05 DIAGNOSIS — J029 Acute pharyngitis, unspecified: Secondary | ICD-10-CM | POA: Insufficient documentation

## 2021-02-05 DIAGNOSIS — F4323 Adjustment disorder with mixed anxiety and depressed mood: Secondary | ICD-10-CM

## 2021-02-05 DIAGNOSIS — M79672 Pain in left foot: Secondary | ICD-10-CM

## 2021-02-05 LAB — CBC WITH DIFFERENTIAL/PLATELET
Basophils Absolute: 0 10*3/uL (ref 0.0–0.1)
Basophils Relative: 1 % (ref 0.0–3.0)
Eosinophils Absolute: 0.1 10*3/uL (ref 0.0–0.7)
Eosinophils Relative: 1.5 % (ref 0.0–5.0)
HCT: 39.7 % (ref 36.0–46.0)
Hemoglobin: 13.4 g/dL (ref 12.0–15.0)
Lymphocytes Relative: 29.5 % (ref 12.0–46.0)
Lymphs Abs: 1.4 10*3/uL (ref 0.7–4.0)
MCHC: 33.8 g/dL (ref 30.0–36.0)
MCV: 92.2 fl (ref 78.0–100.0)
Monocytes Absolute: 0.4 10*3/uL (ref 0.1–1.0)
Monocytes Relative: 7.4 % (ref 3.0–12.0)
Neutro Abs: 3 10*3/uL (ref 1.4–7.7)
Neutrophils Relative %: 60.6 % (ref 43.0–77.0)
Platelets: 240 10*3/uL (ref 150.0–400.0)
RBC: 4.3 Mil/uL (ref 3.87–5.11)
RDW: 12.4 % (ref 11.5–15.5)
WBC: 4.9 10*3/uL (ref 4.0–10.5)

## 2021-02-05 LAB — HEMOGLOBIN A1C: Hgb A1c MFr Bld: 5.3 % (ref 4.6–6.5)

## 2021-02-05 LAB — COMPREHENSIVE METABOLIC PANEL
ALT: 20 U/L (ref 0–35)
AST: 23 U/L (ref 0–37)
Albumin: 4.3 g/dL (ref 3.5–5.2)
Alkaline Phosphatase: 50 U/L (ref 39–117)
BUN: 21 mg/dL (ref 6–23)
CO2: 31 mEq/L (ref 19–32)
Calcium: 9.7 mg/dL (ref 8.4–10.5)
Chloride: 98 mEq/L (ref 96–112)
Creatinine, Ser: 0.62 mg/dL (ref 0.40–1.20)
GFR: 95.56 mL/min (ref >60.00)
Glucose, Bld: 85 mg/dL (ref 70–99)
Potassium: 3.5 mEq/L (ref 3.5–5.1)
Sodium: 136 mEq/L (ref 135–145)
Total Bilirubin: 0.6 mg/dL (ref 0.2–1.2)
Total Protein: 6.8 g/dL (ref 6.0–8.3)

## 2021-02-05 LAB — TSH: TSH: 2.22 u[IU]/mL (ref 0.35–5.50)

## 2021-02-05 LAB — LIPID PANEL
Cholesterol: 195 mg/dL (ref 0–200)
HDL: 70.3 mg/dL (ref >39.00)
LDL Cholesterol: 101 mg/dL — ABNORMAL HIGH (ref 0–99)
NonHDL: 125.14
Total CHOL/HDL Ratio: 3
Triglycerides: 122 mg/dL (ref 0.0–149.0)
VLDL: 24.4 mg/dL (ref 0.0–40.0)

## 2021-02-05 MED ORDER — ZOLMITRIPTAN 5 MG PO TABS: 5.0000 mg | ORAL_TABLET | ORAL | 5 refills | Status: DC | PRN

## 2021-02-05 MED ORDER — ONDANSETRON 4 MG PO TBDP: 4.0000 mg | ORAL_TABLET | Freq: Three times a day (TID) | ORAL | 2 refills | Status: DC | PRN

## 2021-02-05 MED ORDER — MELOXICAM 7.5 MG PO TABS: 7.5000 mg | ORAL_TABLET | Freq: Every day | ORAL | 3 refills | Status: DC

## 2021-02-05 NOTE — Assessment & Plan Note (Addendum)
Chronic Controlled, Stable Continue Pristiq 100 mg daily-prescribed by GYN

## 2021-02-05 NOTE — Assessment & Plan Note (Addendum)
Chronic Taking Pristiq 100 mg daily-prescribed by GYN Controlled

## 2021-02-05 NOTE — Assessment & Plan Note (Signed)
Chronic Check a1c Low sugar / carb diet Stressed regular exercise  

## 2021-02-05 NOTE — Assessment & Plan Note (Signed)
Chronic Has been using a snore guard nightly and her current guard is worn out and she cannot get anyone until next year Advised that if her fatigue does not improve may need to get a second opinion on whether she needs to be on a CPAP machine

## 2021-02-05 NOTE — Assessment & Plan Note (Signed)
Chronic Check CMP

## 2021-02-05 NOTE — Assessment & Plan Note (Addendum)
New Started 1 month ago and has been persistent and fairly constant Does not sound like an infection because most likely it would have gotten better ?  Related to sleep apnea Postnasal drip less likely since I do not think that would be persistent for this long Can try Flonase, over-the-counter antihistamine We will get a new snore guard next year and if there is no improvement and she continues to remain fatigued needs to be reevaluated-May need to see ENT

## 2021-02-05 NOTE — Assessment & Plan Note (Addendum)
Chronic  Will get new snore guard next year-if she continues to have sore throat and or fatigue this could be related to her sleep apnea and she may need to consider CPAP

## 2021-02-05 NOTE — Assessment & Plan Note (Signed)
Chronic Continue Mobic 75 mg daily, which helps-also helps generalized arthritis Discussed possible side effects

## 2021-02-05 NOTE — Assessment & Plan Note (Addendum)
Chronic nurtec 75 mg daily as needed - helps some Will try zomig 5 mg prn-can repeat after 2 hours x 1.  Discussed possible side effects Continue zofran prn

## 2021-02-10 ENCOUNTER — Encounter: Payer: Self-pay | Admitting: Internal Medicine

## 2021-02-25 ENCOUNTER — Telehealth: Payer: Self-pay | Admitting: Gastroenterology

## 2021-02-25 NOTE — Telephone Encounter (Signed)
Went over suprep instructions with new times with the patient. Patient aware she may have a light breakfast on Wednesday morning only-no food Thursday!

## 2021-02-25 NOTE — Telephone Encounter (Signed)
Patient has procedure in two days - needs to start prep tomorrow.  However, the instructions she has are for a morning procedure and the one on the 5th is for the afternoon.  Can you please try to call her today and let her know the times for the new prep and what she should do?  Thank you.

## 2021-02-26 ENCOUNTER — Encounter: Payer: Self-pay | Admitting: Certified Registered Nurse Anesthetist

## 2021-02-27 ENCOUNTER — Ambulatory Visit (AMBULATORY_SURGERY_CENTER): Payer: BC Managed Care – PPO | Admitting: Gastroenterology

## 2021-02-27 ENCOUNTER — Encounter: Payer: Self-pay | Admitting: Gastroenterology

## 2021-02-27 VITALS — BP 114/73 | HR 70 | Temp 96.8°F | Resp 15 | Ht 62.0 in | Wt 145.0 lb

## 2021-02-27 DIAGNOSIS — K635 Polyp of colon: Secondary | ICD-10-CM | POA: Diagnosis not present

## 2021-02-27 DIAGNOSIS — Z1211 Encounter for screening for malignant neoplasm of colon: Secondary | ICD-10-CM

## 2021-02-27 DIAGNOSIS — K640 First degree hemorrhoids: Secondary | ICD-10-CM

## 2021-02-27 DIAGNOSIS — D122 Benign neoplasm of ascending colon: Secondary | ICD-10-CM

## 2021-02-27 DIAGNOSIS — D120 Benign neoplasm of cecum: Secondary | ICD-10-CM

## 2021-02-27 DIAGNOSIS — K5730 Diverticulosis of large intestine without perforation or abscess without bleeding: Secondary | ICD-10-CM

## 2021-02-27 MED ORDER — SODIUM CHLORIDE 0.9 % IV SOLN: 500.0000 mL | Freq: Once | INTRAVENOUS | Status: DC

## 2021-02-27 NOTE — Progress Notes (Signed)
1449 HR < 40 with Robinul 0.2 mg given IV. MD updated, vss

## 2021-02-27 NOTE — Progress Notes (Signed)
Called to room to assist during endoscopic procedure.  Patient ID and intended procedure confirmed with present staff. Received instructions for my participation in the procedure from the performing physician.  

## 2021-02-27 NOTE — Patient Instructions (Signed)
Resume previous medications.  2 polyps removed and sent to pathology.  Await results for final recommendations.  Handouts on findings given to patient (polyps, diverticulosis, hemorrhoids)  YOU HAD AN ENDOSCOPIC PROCEDURE TODAY AT [facility]:   Refer to the procedure report that was given to you for any specific questions about what was found during the examination.  If the procedure report does not answer your questions, please call your gastroenterologist to clarify.  If you requested that your care partner not be given the details of your procedure findings, then the procedure report has been included in a sealed envelope for you to review at your convenience later.  YOU SHOULD EXPECT: Some feelings of bloating in the abdomen. Passage of more gas than usual.  Walking can help get rid of the air that was put into your GI tract during the procedure and reduce the bloating. If you had a lower endoscopy (such as a colonoscopy or flexible sigmoidoscopy) you may notice spotting of blood in your stool or on the toilet paper. If you underwent a bowel prep for your procedure, you may not have a normal bowel movement for a few days.  Please Note:  You might notice some irritation and congestion in your nose or some drainage.  This is from the oxygen used during your procedure.  There is no need for concern and it should clear up in a day or so.  SYMPTOMS TO REPORT IMMEDIATELY:  Following lower endoscopy (colonoscopy or flexible sigmoidoscopy):  Excessive amounts of blood in the stool  Significant tenderness or worsening of abdominal pains  Swelling of the abdomen that is new, acute  Fever of 100F or higher   For urgent or emergent issues, a gastroenterologist can be reached at any hour by calling [phone]. Do not use MyChart messaging for urgent concerns.    DIET:  We do recommend a small meal at first, but then you may proceed to your regular diet.  Drink plenty of fluids but  you should avoid alcoholic beverages for 24 hours.  ACTIVITY:  You should plan to take it easy for the rest of today and you should NOT DRIVE or use heavy machinery until tomorrow (because of the sedation medicines used during the test).    FOLLOW UP: Our staff will call the number listed on your records 48-72 hours following your procedure to check on you and address any questions or concerns that you may have regarding the information given to you following your procedure. If we do not reach you, we will leave a message.  We will attempt to reach you two times.  During this call, we will ask if you have developed any symptoms of COVID 19. If you develop any symptoms (ie: fever, flu-like symptoms, shortness of breath, cough etc.) before then, please call [phone].  If you test positive for Covid 19 in the 2 weeks post procedure, please call and report this information to [place].    If any biopsies were taken you will be contacted by phone or by letter within the next 1-3 weeks.  Please call us at [phone] if you have not heard about the biopsies in 3 weeks.    SIGNATURES/CONFIDENTIALITY: You and/or your care partner have signed paperwork which will be entered into your electronic medical record.  These signatures attest to the fact that that the information above on your After Visit Summary has been reviewed and is understood.  Full responsibility of the confidentiality of  this discharge information lies with you and/or your care-partner.

## 2021-02-27 NOTE — Progress Notes (Signed)
VS by DT  Pt's states no medical or surgical changes since previsit or office visit.  

## 2021-02-27 NOTE — Progress Notes (Signed)
1440 Patient very concerned about experiencing  nausea and vomiting.  MD updated and Zofran 4 mg IV given, vss

## 2021-02-27 NOTE — Op Note (Signed)
[facility] Patient Name: Joy Petty Procedure Date: 02/27/2021 1:54 PM MRN: [id number] Endoscopist: Gerrit Heck , MD Age: 61 Referring MD:  Date of Birth: 1960-04-10 Gender: Female Account #: [id number] Procedure:                Colonoscopy Indications:              Screening for colorectal malignant neoplasm (last                            colonoscopy was more than 10 years ago) Medicines:                Monitored Anesthesia Care Procedure:                Pre-Anesthesia Assessment:                           - Prior to the procedure, a History and Physical                            was performed, and patient medications and                            allergies were reviewed. The patient's tolerance of                            previous anesthesia was also reviewed. The risks                            and benefits of the procedure and the sedation                            options and risks were discussed with the patient.                            All questions were answered, and informed consent                            was obtained. Prior Anticoagulants: The patient has                            taken no previous anticoagulant or antiplatelet                            agents. ASA Grade Assessment: II - A patient with                            mild systemic disease. After reviewing the risks                            and benefits, the patient was deemed in                            satisfactory condition to undergo the procedure.  After obtaining informed consent, the colonoscope                            was passed under direct vision. Throughout the                            procedure, the patient's blood pressure, pulse, and                            oxygen saturations were monitored continuously. The                            CF [id number] #[id number] was introduced through the anus                            and advanced  to the the terminal ileum. The                            colonoscopy was performed without difficulty. The                            patient tolerated the procedure well. The quality                            of the bowel preparation was good. The terminal                            ileum, ileocecal valve, appendiceal orifice, and                            rectum were photographed. Scope In: 2:42:32 PM Scope Out: 3:18:16 PM Scope Withdrawal Time: 0 hours 27 minutes 49 seconds  Total Procedure Duration: 0 hours 35 minutes 44 seconds  Findings:                 Skin tags were found on perianal exam.                           A 2 mm polyp was found in the appendiceal orifice.                            The polyp was sessile. The polyp was removed with a                            cold biopsy forceps. Resection and retrieval were                            complete. Estimated blood loss was minimal.                           A 12 mm polyp was found in the ascending colon. The                            polyp  was flat. The polyp was removed with a saline                            injection-lift technique using a cold snare.                            Resection and retrieval were complete. Estimated                            blood loss was minimal.                           A few small and large-mouthed diverticula were                            found in the sigmoid colon.                           The sigmoid colon and ascending colon revealed                            moderately excessive looping. Advancing the scope                            required using manual pressure.                           Non-bleeding internal hemorrhoids were found during                            retroflexion. The hemorrhoids were small.                           The terminal ileum appeared normal. Complications:            No immediate complications. Estimated Blood Loss:     Estimated blood loss was  minimal. Impression:               - Perianal skin tags found on perianal exam.                           - One 2 mm polyp at the appendiceal orifice,                            removed with a cold biopsy forceps. Resected and                            retrieved.                           - One 12 mm polyp in the ascending colon, removed                            using injection-lift and a cold snare. Resected and  retrieved.                           - Diverticulosis in the sigmoid colon.                           - There was significant looping of the colon.                           - Non-bleeding internal hemorrhoids.                           - The examined portion of the ileum was normal. Recommendation:           - Patient has a contact number available for                            emergencies. The signs and symptoms of potential                            delayed complications were discussed with the                            patient. Return to normal activities tomorrow.                            Written discharge instructions were provided to the                            patient.                           - Resume previous diet.                           - Continue present medications.                           - Await pathology results.                           - Repeat colonoscopy for surveillance based on                            pathology results.                           - Return to GI office PRN.                           - Use fiber, for example Citrucel, Fibercon, Konsyl                            or Metamucil.                           - Internal hemorrhoids were noted on this study and  may be amenable to hemorrhoid band ligation. If you                            are interested in further treatment of these                            hemorrhoids with band ligation, please contact my                             clinic to set up an appointment for evaluation and                            treatment. Gerrit Heck, MD 02/27/2021 3:23:39 PM

## 2021-02-27 NOTE — Progress Notes (Signed)
GASTROENTEROLOGY PROCEDURE H&P NOTE   Primary Care Physician: Binnie Rail, MD    Reason for Procedure:  Colon Cancer screening  Plan:    Colonoscopy  Patient is appropriate for endoscopic procedure(s) in the ambulatory ([facility]) setting.  The nature of the procedure, as well as the risks, benefits, and alternatives were carefully and thoroughly reviewed with the patient. Ample time for discussion and questions allowed. The patient understood, was satisfied, and agreed to proceed.     HPI: Joy Petty is a 61 y.o. female who presents for colonoscopy for routine Colon Cancer screening.  No active GI symptoms.  No known family history of colon cancer or related malignancy.  Patient is otherwise without complaints or active issues today.  Past Medical History:  Diagnosis Date   Anxiety    Depression    anxiety   Insomnia    Kidney stones    multiple-4/16   Post-operative nausea and vomiting    SUI (stress urinary incontinence, female)     Past Surgical History:  Procedure Laterality Date   COLONOSCOPY     10 years ago in [place], [place]-normal exam   CYSTOSCOPY/RETROGRADE/URETEROSCOPY/STONE EXTRACTION WITH BASKET  05/2014   Dr. Jonette Eva   FOOT FRACTURE SURGERY Right 2019   FOOT SURGERY  2005   right foot   LAPAROSCOPIC APPENDECTOMY  2006   LITHOTRIPSY  05/2014   Dr. Jonette Eva   [facility]  03/2009   Dr Matilde Sprang     Prior to Admission medications   Medication Sig Start Date End Date Taking? Authorizing Provider  desvenlafaxine (PRISTIQ) 100 MG 24 hr tablet Take 1 tablet (100 mg total) by mouth daily. 10/23/20  Yes Megan Salon, MD  ibuprofen (ADVIL,MOTRIN) 600 MG tablet Take 1 tablet (600 mg total) by mouth every 6 (six) hours as needed. 06/08/14  Yes Horton, Barbette Hair, MD  indapamide (LOZOL) 1.25 MG tablet Take 1 tablet (1.25 mg total) by mouth daily. 01/13/21  Yes Megan Salon, MD  meloxicam (MOBIC) 7.5 MG tablet Take 1 tablet (7.5 mg  total) by mouth daily. 02/05/21  Yes Burns, Claudina Lick, MD  mometasone (ELOCON) 0.1 % ointment Apply topically daily. 12/05/20  Yes Megan Salon, MD  Nutritional Supplements (JUICE PLUS FIBRE PO) Take by mouth.   Yes [provider]  Potassium Citrate 15 MEQ (1620 MG) TBCR Take 1 tablet by mouth 2 (two) times daily with a meal. 02/02/20  Yes Burns, Claudina Lick, MD  fluticasone (FLONASE) 50 MCG/ACT nasal spray daily as needed. 09/17/15   [provider]  ondansetron (ZOFRAN ODT) 4 MG disintegrating tablet Take 1 tablet (4 mg total) by mouth every 8 (eight) hours as needed for nausea or vomiting. 02/05/21   Binnie Rail, MD  Rimegepant Sulfate (NURTEC) 75 MG TBDP Take 75 mg by mouth daily as needed for up to 1 dose (migraine). MDD 1 05/29/20   Binnie Rail, MD  zolmitriptan (ZOMIG) 5 MG tablet Take 1 tablet (5 mg total) by mouth as needed for migraine. May repeat x 1 after 2 hr prn 02/05/21   Binnie Rail, MD    Current Outpatient Medications  Medication Sig Dispense Refill   desvenlafaxine (PRISTIQ) 100 MG 24 hr tablet Take 1 tablet (100 mg total) by mouth daily. 90 tablet 4   ibuprofen (ADVIL,MOTRIN) 600 MG tablet Take 1 tablet (600 mg total) by mouth every 6 (six) hours as needed. 30 tablet 0   indapamide (LOZOL)  1.25 MG tablet Take 1 tablet (1.25 mg total) by mouth daily. 90 tablet 0   meloxicam (MOBIC) 7.5 MG tablet Take 1 tablet (7.5 mg total) by mouth daily. 90 tablet 3   mometasone (ELOCON) 0.1 % ointment Apply topically daily. 45 g 2   Nutritional Supplements (JUICE PLUS FIBRE PO) Take by mouth.     Potassium Citrate 15 MEQ (1620 MG) TBCR Take 1 tablet by mouth 2 (two) times daily with a meal. 180 tablet 2   fluticasone (FLONASE) 50 MCG/ACT nasal spray daily as needed.     ondansetron (ZOFRAN ODT) 4 MG disintegrating tablet Take 1 tablet (4 mg total) by mouth every 8 (eight) hours as needed for nausea or vomiting. 20 tablet 2   Rimegepant Sulfate (NURTEC) 75 MG TBDP Take  75 mg by mouth daily as needed for up to 1 dose (migraine). MDD 1 8 tablet 3   zolmitriptan (ZOMIG) 5 MG tablet Take 1 tablet (5 mg total) by mouth as needed for migraine. May repeat x 1 after 2 hr prn 10 tablet 5   Current Facility-Administered Medications  Medication Dose Route Frequency Provider Last Rate Last Admin   0.9 %  sodium chloride infusion  500 mL Intravenous Once Ayden Hardwick V, DO        Allergies as of 02/27/2021 - Review Complete 02/27/2021  Allergen Reaction Noted   Amoxicillin-pot clavulanate Diarrhea 09/17/2015   Codeine  06/10/2014   Hydrocodone  06/10/2014   Sulfa antibiotics Nausea And Vomiting 06/14/2012    Family History  Problem Relation Age of Onset   Hypertension Mother    Thyroid disease Mother    Lung cancer Mother    Throat cancer Mother    Heart disease Father    Heart disease Brother    Kidney cancer Brother    Prostate cancer Brother    Diabetes Paternal Grandfather    Diabetes Other    Colon cancer Neg Hx    Colon polyps Neg Hx    Esophageal cancer Neg Hx    Stomach cancer Neg Hx    Rectal cancer Neg Hx     Social History   Socioeconomic History   Marital status: Married    Spouse name: Not on file   Number of children: Not on file   Years of education: Not on file   Highest education level: Not on file  Occupational History   Not on file  Tobacco Use   Smoking status: Never   Smokeless tobacco: Never  Vaping Use   Vaping Use: Never used  Substance and Sexual Activity   Alcohol use: Yes    Alcohol/week: 7.0 standard drinks    Types: 7 Standard drinks or equivalent per week   Drug use: No   Sexual activity: Yes    Partners: Male    Birth control/protection: Post-menopausal  Other Topics Concern   Not on file  Social History Narrative   Not on file   Social Determinants of Health   Financial Resource Strain: Not on file  Food Insecurity: Not on file  Transportation Needs: Not on file  Physical Activity: Not on  file  Stress: Not on file  Social Connections: Not on file  Intimate Partner Violence: Not on file    Physical Exam: Vital signs in last 24 hours: @BP  118/73    Pulse 84    Temp (!) 96.8 F (36 C) (Skin)    Ht 5\' 2"  (1.575 m)    Wt 145 lb (  65.8 kg)    LMP 02/23/2009    SpO2 96%    BMI 26.52 kg/m  GEN: NAD EYE: Sclerae anicteric ENT: MMM CV: Non-tachycardic Pulm: CTA b/l GI: Soft, NT/ND NEURO:  Alert & Oriented x 3   Gerrit Heck, DO [facility]   02/27/2021 2:22 PM

## 2021-02-27 NOTE — Progress Notes (Signed)
Report given to PACU, vss 

## 2021-03-03 ENCOUNTER — Telehealth: Payer: Self-pay | Admitting: *Deleted

## 2021-03-03 ENCOUNTER — Telehealth: Payer: Self-pay

## 2021-03-03 NOTE — Telephone Encounter (Signed)
First follow up call attempt.  Unable to leave vm, phone disconnected half way through message.

## 2021-03-03 NOTE — Telephone Encounter (Signed)
Second attempt follow up call to pt, LM on VM ?

## 2021-03-06 ENCOUNTER — Encounter: Payer: Self-pay | Admitting: Gastroenterology

## 2021-04-06 ENCOUNTER — Encounter: Payer: Self-pay | Admitting: Internal Medicine

## 2021-04-07 ENCOUNTER — Other Ambulatory Visit: Payer: Self-pay

## 2021-04-07 MED ORDER — INDAPAMIDE 1.25 MG PO TABS: 1.2500 mg | ORAL_TABLET | Freq: Every day | ORAL | 0 refills | Status: DC

## 2021-04-07 MED ORDER — POTASSIUM CITRATE ER 15 MEQ (1620 MG) PO TBCR: 1.0000 | EXTENDED_RELEASE_TABLET | Freq: Two times a day (BID) | ORAL | 2 refills | Status: DC

## 2021-04-17 ENCOUNTER — Encounter: Payer: Self-pay | Admitting: Internal Medicine

## 2021-04-17 DIAGNOSIS — G43809 Other migraine, not intractable, without status migrainosus: Secondary | ICD-10-CM

## 2021-05-06 ENCOUNTER — Encounter: Payer: Self-pay | Admitting: Internal Medicine

## 2021-05-14 ENCOUNTER — Encounter: Payer: Self-pay | Admitting: Internal Medicine

## 2021-05-16 ENCOUNTER — Other Ambulatory Visit: Payer: Self-pay

## 2021-05-16 ENCOUNTER — Ambulatory Visit: Payer: BC Managed Care – PPO | Admitting: Family Medicine

## 2021-05-16 ENCOUNTER — Encounter: Payer: Self-pay | Admitting: Family Medicine

## 2021-05-16 VITALS — BP 122/76 | HR 88 | Temp 98.2°F | Ht 62.0 in | Wt 151.2 lb

## 2021-05-16 DIAGNOSIS — R0982 Postnasal drip: Secondary | ICD-10-CM | POA: Diagnosis not present

## 2021-05-16 DIAGNOSIS — H6692 Otitis media, unspecified, left ear: Secondary | ICD-10-CM | POA: Diagnosis not present

## 2021-05-16 DIAGNOSIS — J3489 Other specified disorders of nose and nasal sinuses: Secondary | ICD-10-CM

## 2021-05-16 DIAGNOSIS — R051 Acute cough: Secondary | ICD-10-CM

## 2021-05-16 MED ORDER — BENZONATATE 200 MG PO CAPS: 200.0000 mg | ORAL_CAPSULE | Freq: Two times a day (BID) | ORAL | 0 refills | Status: DC | PRN

## 2021-05-16 MED ORDER — DOXYCYCLINE HYCLATE 100 MG PO TABS: 100.0000 mg | ORAL_TABLET | Freq: Two times a day (BID) | ORAL | 0 refills | Status: DC

## 2021-05-16 NOTE — Progress Notes (Signed)
? ?Subjective:  ? ? Patient ID: Joy Petty, female    DOB: 05-Jul-1960, 61 y.o.   MRN: [id number] ? ?Ear Fullness  ?Associated symptoms include coughing.  ?Cough ? ?Chief Complaint  ?Patient presents with  ? Ear Fullness  ?  Bilateral ear fullness, pt states she has been sick since this past Sunday and is unsure when it first started. Her guess is [employer]. Pt would like to check if she has an ear infection  ? Cough  ?  Pt states she has been having a dry cough which has started to give her a sore throat.  ? ?Here with complaints of a 2 week history of URI symptoms.  ?She had 2 negative Covid tests at onset of symptoms.  ? ?Diagnosed with viral illness at an urgent care 1 1/2 weeks ago. Treating symptoms with Tylenol multi- symptom medication and Advil.  ? ?Continues having ear fullness, worse on left, cough, sore throat, fatigue. Cough is worse at night, keeping her awake.  ? ?No fever, chills, chest pain, palpitations, shortness of breath, abdominal pain, N/V/D.  ? ? ? ? ?Past Medical History:  ?Diagnosis Date  ? Anxiety   ? Depression   ? anxiety  ? Insomnia   ? Kidney stones   ? multiple-4/16  ? Post-operative nausea and vomiting   ? SUI (stress urinary incontinence, female)   ? ?Current Outpatient Medications on File Prior to Visit  ?Medication Sig Dispense Refill  ? desvenlafaxine (PRISTIQ) 100 MG 24 hr tablet Take 1 tablet (100 mg total) by mouth daily. 90 tablet 4  ? fluticasone (FLONASE) 50 MCG/ACT nasal spray daily as needed.    ? ibuprofen (ADVIL,MOTRIN) 600 MG tablet Take 1 tablet (600 mg total) by mouth every 6 (six) hours as needed. 30 tablet 0  ? indapamide (LOZOL) 1.25 MG tablet Take 1 tablet (1.25 mg total) by mouth daily. 90 tablet 0  ? meloxicam (MOBIC) 7.5 MG tablet Take 1 tablet (7.5 mg total) by mouth daily. 90 tablet 3  ? mometasone (ELOCON) 0.1 % ointment Apply topically daily. 45 g 2  ? Nutritional Supplements (JUICE PLUS FIBRE PO) Take by mouth.    ? ondansetron  (ZOFRAN ODT) 4 MG disintegrating tablet Take 1 tablet (4 mg total) by mouth every 8 (eight) hours as needed for nausea or vomiting. 20 tablet 2  ? Potassium Citrate 15 MEQ (1620 MG) TBCR Take 1 tablet by mouth 2 (two) times daily with a meal. 180 tablet 2  ? Rimegepant Sulfate (NURTEC) 75 MG TBDP Take 75 mg by mouth daily as needed for up to 1 dose (migraine). MDD 1 8 tablet 3  ? zolmitriptan (ZOMIG) 5 MG tablet Take 1 tablet (5 mg total) by mouth as needed for migraine. May repeat x 1 after 2 hr prn 10 tablet 5  ? ?No current facility-administered medications on file prior to visit.  ? ? ? ? ?Review of Systems  ?Respiratory:  Positive for cough.   ?Pertinent positives and negatives in the history of present illness. ? ?   ?Objective:  ? Physical Exam ?Constitutional:   ?   General: She is not in acute distress. ?   Appearance: Normal appearance. She is not ill-appearing.  ?HENT:  ?   Right Ear: Tympanic membrane and ear canal normal.  ?   Left Ear: Tympanic membrane is injected and retracted.  ?   Nose: Congestion present.  ?   Mouth/Throat:  ?   Mouth: Mucous membranes are  moist.  ?   Pharynx: Posterior oropharyngeal erythema present. No oropharyngeal exudate.  ?Eyes:  ?   Conjunctiva/sclera: Conjunctivae normal.  ?   Pupils: Pupils are equal, round, and reactive to light.  ?Cardiovascular:  ?   Rate and Rhythm: Normal rate and regular rhythm.  ?Pulmonary:  ?   Effort: Pulmonary effort is normal.  ?   Breath sounds: Normal breath sounds.  ?Musculoskeletal:     ?   General: Normal range of motion.  ?   Cervical back: Normal range of motion and neck supple.  ?   Right lower leg: No edema.  ?   Left lower leg: No edema.  ?Lymphadenopathy:  ?   Cervical: No cervical adenopathy.  ?Skin: ?   General: Skin is warm and dry.  ?Neurological:  ?   General: No focal deficit present.  ?   Mental Status: She is alert.  ? ?BP 122/76 (BP Location: Left Arm, Patient Position: Sitting, Cuff Size: Large)   Pulse 88   Temp 98.2 ?F  (36.8 ?C) (Oral)   Ht '5\' 2"'$  (1.575 m)   Wt 151 lb 3.2 oz (68.6 kg)   LMP 02/23/2009   SpO2 99%   BMI 27.65 kg/m?  ? ? ? ? ?   ?Assessment & Plan:  ?Acute left otitis media - Plan: doxycycline (VIBRA-TABS) 100 MG tablet ? ?Sinus pain ? ?Post-nasal drainage ? ?Acute cough - Plan: benzonatate (TESSALON) 200 MG capsule ? ?Antibiotic prescribed.  Doxycycline chosen due to patient allergies and preference.  Tessalon prescribed.  Counseling on symptomatic management.  She will follow-up if worsening or not back to baseline when she completes the antibiotic ? ?

## 2021-05-26 ENCOUNTER — Encounter: Payer: Self-pay | Admitting: Family Medicine

## 2021-05-27 NOTE — Telephone Encounter (Signed)
Pt has finished antibiotic and denies pain in her ear. She is still experiencing fullness feeling in her ear and not hearing well. She would like to know if you recommend anything? ?

## 2021-05-28 ENCOUNTER — Encounter: Payer: Self-pay | Admitting: Internal Medicine

## 2021-05-29 ENCOUNTER — Encounter: Payer: Self-pay | Admitting: Family Medicine

## 2021-05-29 ENCOUNTER — Ambulatory Visit: Payer: BC Managed Care – PPO | Admitting: Family Medicine

## 2021-05-29 VITALS — BP 112/74 | HR 92 | Temp 97.6°F

## 2021-05-29 DIAGNOSIS — H6122 Impacted cerumen, left ear: Secondary | ICD-10-CM

## 2021-05-29 DIAGNOSIS — H938X3 Other specified disorders of ear, bilateral: Secondary | ICD-10-CM | POA: Diagnosis not present

## 2021-05-29 DIAGNOSIS — H6692 Otitis media, unspecified, left ear: Secondary | ICD-10-CM | POA: Diagnosis not present

## 2021-05-29 MED ORDER — AZITHROMYCIN 250 MG PO TABS: ORAL_TABLET | ORAL | 0 refills | Status: AC

## 2021-05-29 NOTE — Progress Notes (Signed)
? ?  Subjective:  ? ? Patient ID: Joy Petty, female    DOB: [birth year], 61 y.o.   MRN: [id number] ? ?HPI ?Chief Complaint  ?Patient presents with  ? Follow-up  ?  Still experiencing "fullness" mostly in left ear but pressure in the right. Started back cough only when she sleeps 3-4 nights ago.   ? ?She is here with concerns regarding continued fullness sensation in bilateral ears, worse on the left. Recent otitis media on left and completed a course of Doxycycline.  ?States she only feels approximately 60% improved.  ?Continues having a mild cough, taking Tessalon which helps.  ? ?Denies fever, chills, dizziness, chest pain, palpitations, shortness of breath, abdominal pain, N/V/D.  ? ? ? ?Review of Systems ?Pertinent positives and negatives in the history of present illness. ? ?   ?Objective:  ? Physical Exam ?Constitutional:   ?   Appearance: Normal appearance. She is not ill-appearing.  ?HENT:  ?   Right Ear: Tympanic membrane normal.  ?   Left Ear: No decreased hearing noted. Tympanic membrane is retracted.  ?   Ears:  ?   Comments: Left TM dull, retracted. Excessive cerumen prior to ear lavage by CMA. Canal clear post lavage.  ?   Mouth/Throat:  ?   Mouth: Mucous membranes are moist.  ?   Pharynx: No oropharyngeal exudate or posterior oropharyngeal erythema.  ?Eyes:  ?   Conjunctiva/sclera: Conjunctivae normal.  ?   Pupils: Pupils are equal, round, and reactive to light.  ?Cardiovascular:  ?   Rate and Rhythm: Normal rate and regular rhythm.  ?   Pulses: Normal pulses.  ?Pulmonary:  ?   Effort: Pulmonary effort is normal.  ?   Breath sounds: Normal breath sounds.  ?Musculoskeletal:  ?   Cervical back: Normal range of motion and neck supple.  ?Lymphadenopathy:  ?   Cervical: No cervical adenopathy.  ?Skin: ?   General: Skin is warm and dry.  ?Neurological:  ?   General: No focal deficit present.  ?   Mental Status: She is alert and oriented to person, place, and time.  ? ?BP 112/74 (BP Location:  Left Arm, Patient Position: Sitting, Cuff Size: Large)   Pulse 92   Temp 97.6 ?F (36.4 ?C) (Temporal)   LMP 02/23/2009   SpO2 100%  ? ? ?   ?Assessment & Plan:  ?Sensation of fullness in both ears ? ?Excessive cerumen in ear canal, left ? ?Acute left otitis media - Plan: azithromycin (ZITHROMAX) 250 MG tablet ? ?She is only approximately 60% improved since completing the course of doxycycline.  She is interested in trying a Z-Pak which I think is appropriate and this was prescribed.  She will try an oral decongestant and Flonase.  Discussed trying an antihistamine in case allergies are playing a role in her current illness. ?Tolerated left ear lavage well. ? ?

## 2021-05-29 NOTE — Patient Instructions (Addendum)
?  Start the Z-pak today as prescribed.  ? ?Continue using Flonase.  ?You may want to try taking Sudafed or the generic (decongestant) for a few days to see if this helps.  ?If you think allergies are a concern, try over the counter Xyzal or Claritin or Allegra for a week or so to see if this helps.  ? ? ?

## 2021-08-09 ENCOUNTER — Encounter: Payer: Self-pay | Admitting: Internal Medicine

## 2021-08-11 ENCOUNTER — Other Ambulatory Visit: Payer: Self-pay

## 2021-08-11 MED ORDER — INDAPAMIDE 1.25 MG PO TABS: 1.2500 mg | ORAL_TABLET | Freq: Every day | ORAL | 0 refills | Status: DC

## 2021-10-30 ENCOUNTER — Encounter (HOSPITAL_BASED_OUTPATIENT_CLINIC_OR_DEPARTMENT_OTHER): Payer: Self-pay | Admitting: Obstetrics & Gynecology

## 2021-10-30 ENCOUNTER — Ambulatory Visit (INDEPENDENT_AMBULATORY_CARE_PROVIDER_SITE_OTHER): Payer: BC Managed Care – PPO | Admitting: Obstetrics & Gynecology

## 2021-10-30 VITALS — BP 135/91 | HR 85 | Ht 62.0 in | Wt 160.6 lb

## 2021-10-30 DIAGNOSIS — L900 Lichen sclerosus et atrophicus: Secondary | ICD-10-CM | POA: Diagnosis not present

## 2021-10-30 DIAGNOSIS — Z780 Asymptomatic menopausal state: Secondary | ICD-10-CM | POA: Diagnosis not present

## 2021-10-30 DIAGNOSIS — Z8659 Personal history of other mental and behavioral disorders: Secondary | ICD-10-CM

## 2021-10-30 DIAGNOSIS — Z01419 Encounter for gynecological examination (general) (routine) without abnormal findings: Secondary | ICD-10-CM

## 2021-10-30 MED ORDER — MOMETASONE FUROATE 0.1 % EX OINT: TOPICAL_OINTMENT | Freq: Every day | CUTANEOUS | 2 refills | Status: DC

## 2021-10-30 MED ORDER — DESVENLAFAXINE SUCCINATE ER 100 MG PO TB24: 100.0000 mg | ORAL_TABLET | Freq: Every day | ORAL | 4 refills | Status: DC

## 2021-10-30 NOTE — Progress Notes (Unsigned)
61 y.o. [id number] Married White or Caucasian female here for annual exam.  Lots of stressors with daughter.  Is having issues with complex migraines.  Had issues when at the beach so seeing a neurologist in [place].  Reports she hasn't been exercising as she hasn't felt great.    Denies vaginal bleeding.    H/o lichen sclerosus.  Denies vaginal bleeding.    Patient's last menstrual period was 02/23/2009.          Sexually active: No.  The current method of family planning is post menopausal status.    Exercising: no  Smoker:  no  Health Maintenance: Pap:  09/2020 neg with neg HR HPV History of abnormal Pap:  no MMG:  10/2020 Colonoscopy:  02/2021, follow up 3 years BMD:   2020, osteopenia Screening Labs: done with Dr. Quay Burow   reports that she has never smoked. She has never used smokeless tobacco. She reports current alcohol use of about 7.0 standard drinks of alcohol per week. She reports that she does not use drugs.  Past Medical History:  Diagnosis Date   Anxiety    Depression    anxiety   Insomnia    Kidney stones    multiple-4/16   Post-operative nausea and vomiting    SUI (stress urinary incontinence, female)     Past Surgical History:  Procedure Laterality Date   COLONOSCOPY     10 years ago in [place], [place]-normal exam   CYSTOSCOPY/RETROGRADE/URETEROSCOPY/STONE EXTRACTION WITH BASKET  05/2014   Dr. Jonette Eva   FOOT FRACTURE SURGERY Right 2019   FOOT SURGERY  2005   right foot   LAPAROSCOPIC APPENDECTOMY  2006   LITHOTRIPSY  05/2014   Dr. Jonette Eva   URETHRAL SLING  03/2009   Dr Matilde Sprang     Current Outpatient Medications  Medication Sig Dispense Refill   benzonatate (TESSALON) 200 MG capsule Take 1 capsule (200 mg total) by mouth 2 (two) times daily as needed for cough. 20 capsule 0   desvenlafaxine (PRISTIQ) 100 MG 24 hr tablet Take 1 tablet (100 mg total) by mouth daily. 90 tablet 4   fluticasone (FLONASE) 50 MCG/ACT nasal spray daily as needed.      ibuprofen (ADVIL,MOTRIN) 600 MG tablet Take 1 tablet (600 mg total) by mouth every 6 (six) hours as needed. 30 tablet 0   indapamide (LOZOL) 1.25 MG tablet Take 1 tablet (1.25 mg total) by mouth daily. 90 tablet 0   meloxicam (MOBIC) 7.5 MG tablet Take 1 tablet (7.5 mg total) by mouth daily. 90 tablet 3   mometasone (ELOCON) 0.1 % ointment Apply topically daily. 45 g 2   Nutritional Supplements (JUICE PLUS FIBRE PO) Take by mouth.     ondansetron (ZOFRAN ODT) 4 MG disintegrating tablet Take 1 tablet (4 mg total) by mouth every 8 (eight) hours as needed for nausea or vomiting. 20 tablet 2   Potassium Citrate 15 MEQ (1620 MG) TBCR Take 1 tablet by mouth 2 (two) times daily with a meal. 180 tablet 2   Rimegepant Sulfate (NURTEC) 75 MG TBDP Take 75 mg by mouth daily as needed for up to 1 dose (migraine). MDD 1 8 tablet 3   zolmitriptan (ZOMIG) 5 MG tablet Take 1 tablet (5 mg total) by mouth as needed for migraine. May repeat x 1 after 2 hr prn 10 tablet 5   No current facility-administered medications for this visit.    Family History  Problem Relation Age of Onset  Hypertension Mother    Thyroid disease Mother    Lung cancer Mother    Throat cancer Mother    Heart disease Father    Heart disease Brother    Kidney cancer Brother    Prostate cancer Brother    Diabetes Paternal Grandfather    Diabetes Other    Colon cancer Neg Hx    Colon polyps Neg Hx    Esophageal cancer Neg Hx    Stomach cancer Neg Hx    Rectal cancer Neg Hx     ROS: Constitutional: negative Genitourinary:negative  Exam:   BP (!) 135/91 (BP Location: Left Arm, Patient Position: Sitting, Cuff Size: Normal)   Pulse 85   Ht '5\' 2"'$  (1.575 m) Comment: Reported  Wt 160 lb 9.6 oz (72.8 kg)   LMP 02/23/2009   BMI 29.37 kg/m   Height: '5\' 2"'$  (157.5 cm) (Reported)  General appearance: alert, cooperative and appears stated age Head: Normocephalic, without obvious abnormality, atraumatic Neck: no adenopathy, supple,  symmetrical, trachea midline and thyroid normal to inspection and palpation Lungs: clear to auscultation bilaterally Breasts: normal appearance, no masses or tenderness Heart: regular rate and rhythm Abdomen: soft, non-tender; bowel sounds normal; no masses,  no organomegaly Extremities: extremities normal, atraumatic, no cyanosis or edema Skin: Skin color, texture, turgor normal. No rashes or lesions Lymph nodes: Cervical, supraclavicular, and axillary nodes normal. No abnormal inguinal nodes palpated Neurologic: Grossly normal   Pelvic: External genitalia:  no lesions              Urethra:  normal appearing urethra with no masses, tenderness or lesions              Bartholins and Skenes: normal                 Vagina: normal appearing vagina with normal color and no discharge, no lesions              Cervix: no lesions              Pap taken: No. Bimanual Exam:  Uterus:  normal size, contour, position, consistency, mobility, non-tender              Adnexa: normal adnexa and no mass, fullness, tenderness               Rectovaginal: Confirms               Anus:  normal sphincter tone, no lesions  Chaperone, Octaviano Batty, CMA, was present for exam.  Assessment/Plan: 1. Well woman exam with routine gynecological exam - Pap smear 09/2020 neg with neg HR HPV.  Not indicated today. - Mammogram 10/2020. - Colonoscopy 02/2021.  Follow up 3 years. - Bone mineral density 2020, osteopenia. - lab work done done with Dr. Quay Burow - vaccines reviewed/updated  2. Lichen sclerosus - normal exam today - mometasone (ELOCON) 0.1 % ointment; Apply topically daily.  Dispense: 45 g; Refill: 2  3. Postmenopausal - no HRT  4. History of depression - desvenlafaxine (PRISTIQ) 100 MG 24 hr tablet; Take 1 tablet (100 mg total) by mouth daily.  Dispense: 90 tablet; Refill: 4

## 2021-11-03 ENCOUNTER — Other Ambulatory Visit: Payer: Self-pay | Admitting: Internal Medicine

## 2021-11-04 MED ORDER — INDAPAMIDE 1.25 MG PO TABS: 1.2500 mg | ORAL_TABLET | Freq: Every day | ORAL | 0 refills | Status: DC

## 2021-11-22 IMAGING — MG MM DIGITAL SCREENING BILAT W/ TOMO AND CAD
8 series · 8 of 24 positions shown · non-contrast
Comparison: Previous exam(s).

CLINICAL DATA: Screening.

EXAM:
DIGITAL SCREENING BILATERAL MAMMOGRAM WITH TOMOSYNTHESIS AND CAD
TECHNIQUE: Bilateral screening digital craniocaudal and mediolateral oblique
mammograms were obtained. Bilateral screening digital breast
tomosynthesis was performed. The images were evaluated with
computer-aided detection.

[R CC synth-2D]
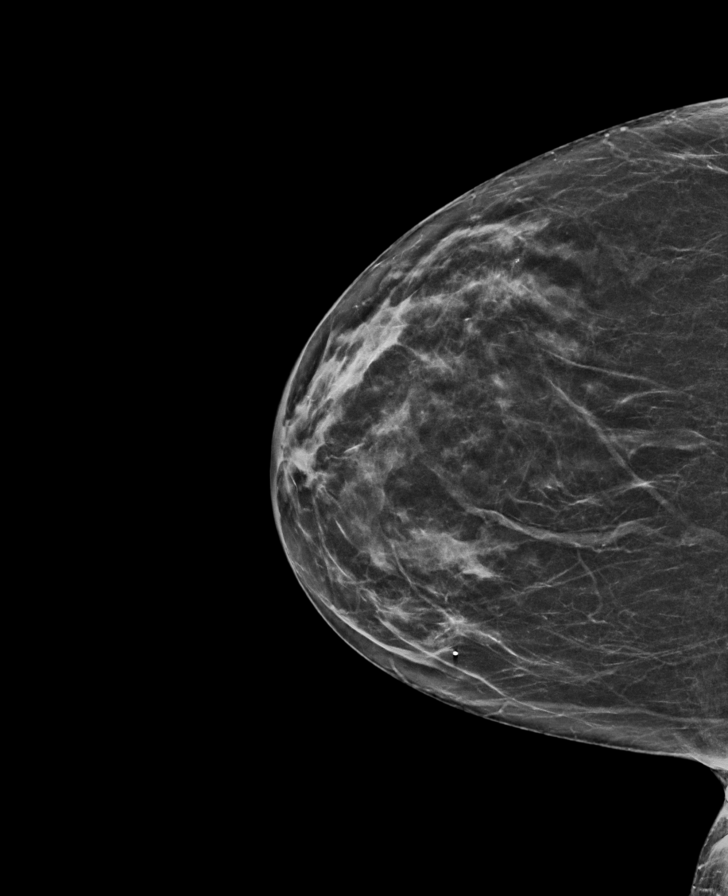

[L CC synth-2D]
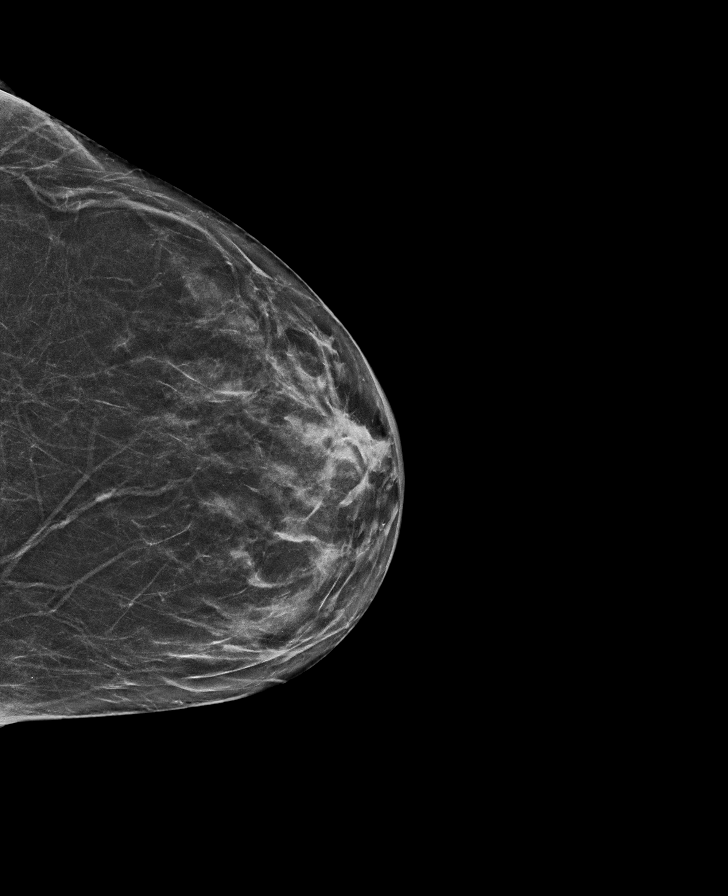

[L MLO synth-2D]
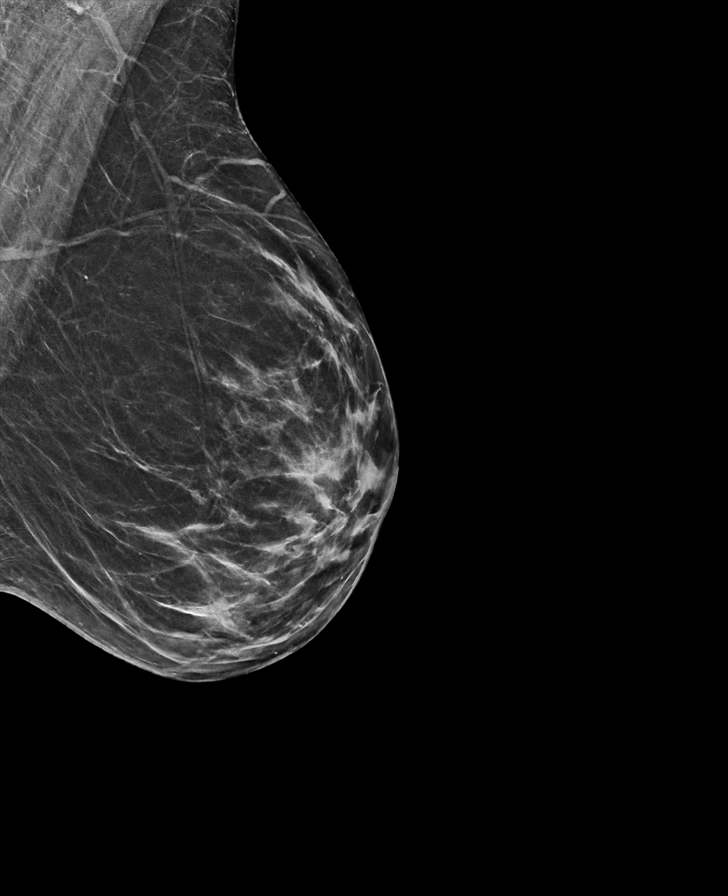

[R MLO synth-2D]
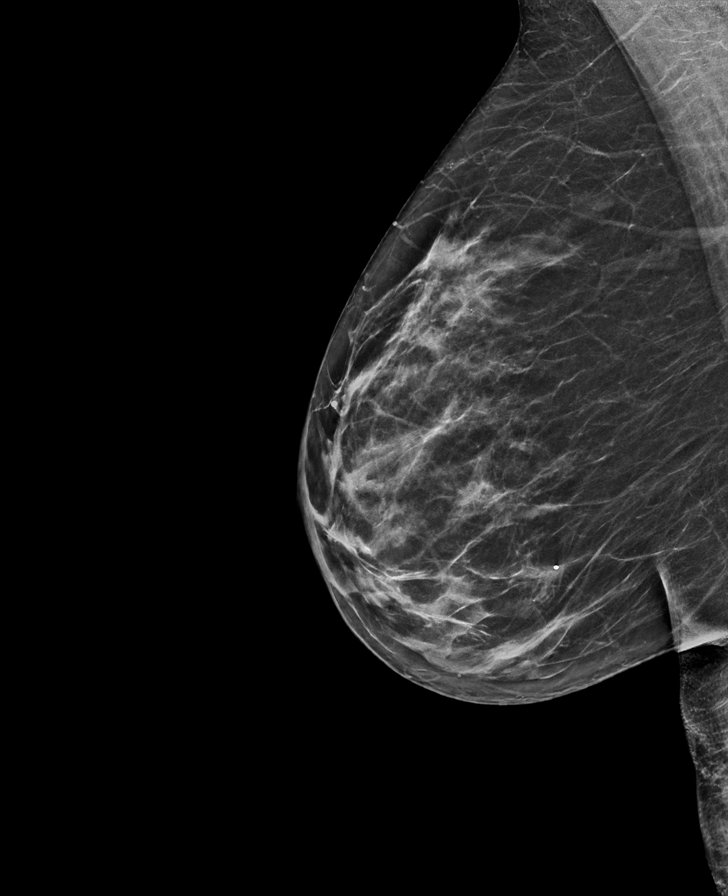

[L CC tomo · tomo slice 31/62.0]
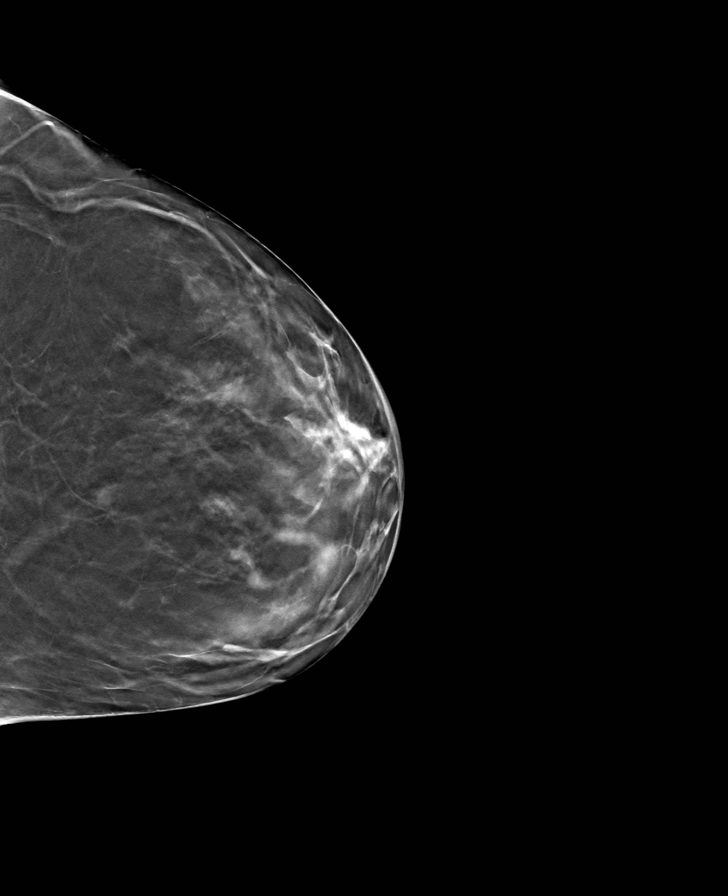

[R CC tomo · tomo slice 31/60.0]
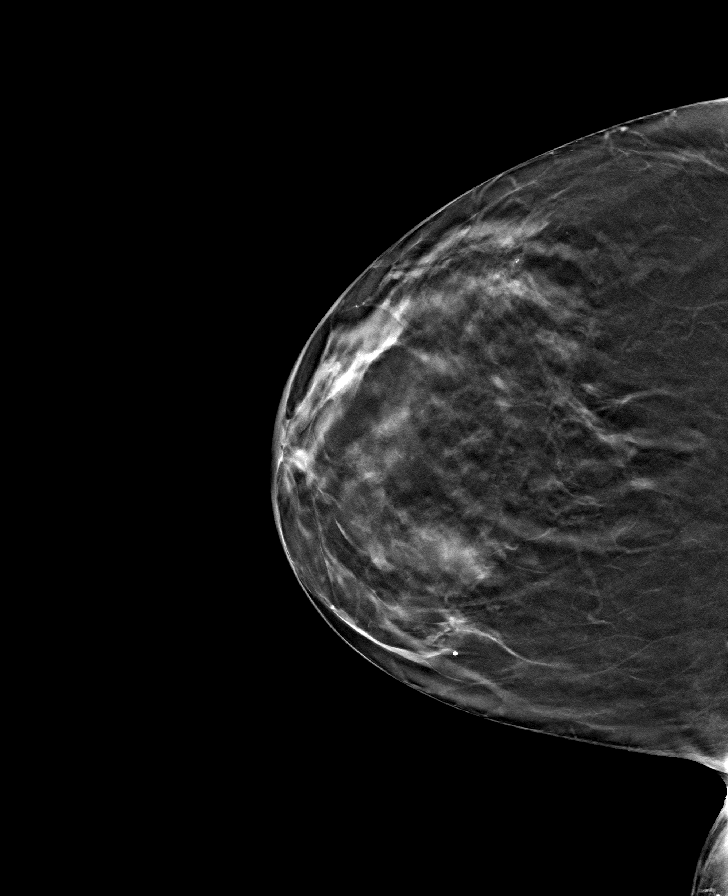

[L MLO tomo · tomo slice 33/65.0]
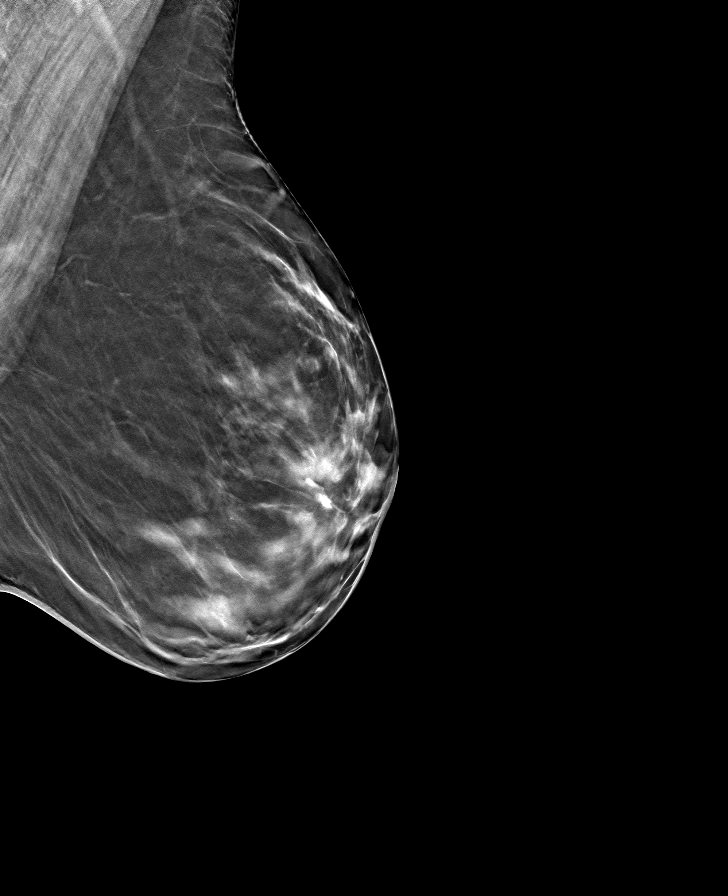

[R MLO tomo · tomo slice 34/67.0]
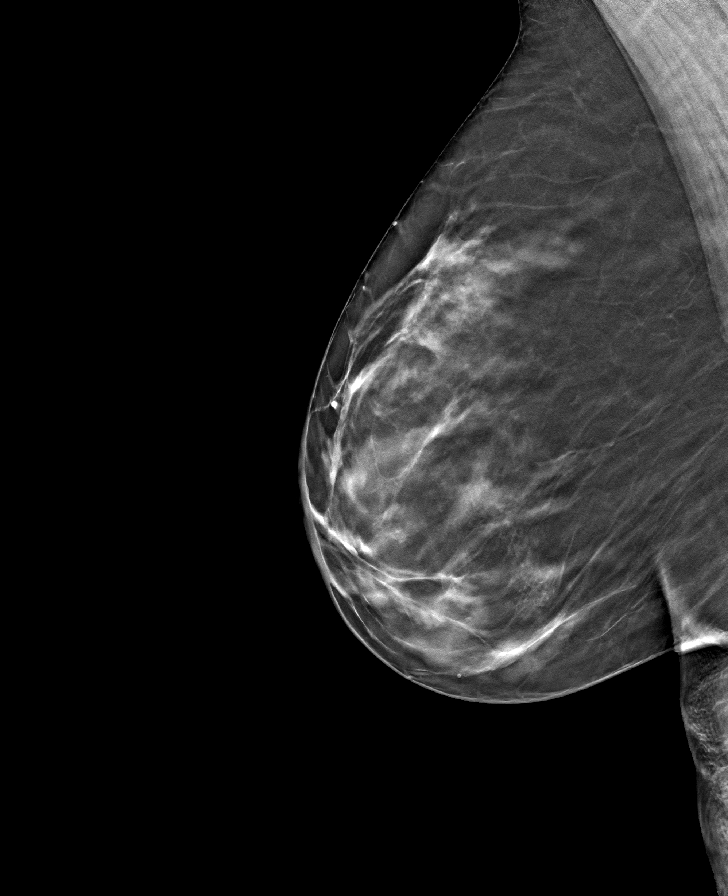

[8 of 24 positions shown; findings below may reference images not displayed]

ACR Breast Density Category b: There are scattered areas of
fibroglandular density.
FINDINGS: There are no findings suspicious for malignancy.
IMPRESSION: No mammographic evidence of malignancy. A result letter of this
screening mammogram will be mailed directly to the patient.

RECOMMENDATION:
Screening mammogram in one year. (Code:51-O-LD2)

BI-RADS CATEGORY  1: Negative.

## 2022-02-14 ENCOUNTER — Encounter: Payer: Self-pay | Admitting: Internal Medicine

## 2022-02-18 ENCOUNTER — Other Ambulatory Visit: Payer: Self-pay

## 2022-02-18 MED ORDER — INDAPAMIDE 1.25 MG PO TABS: 1.2500 mg | ORAL_TABLET | Freq: Every day | ORAL | 0 refills | Status: DC

## 2022-02-20 ENCOUNTER — Ambulatory Visit: Payer: BC Managed Care – PPO | Admitting: Internal Medicine

## 2022-02-24 NOTE — Progress Notes (Unsigned)
    Subjective:    Patient ID: Joy Petty, female    DOB: 1960/10/23, 62 y.o.   MRN: [id number]      HPI Takeia is here for No chief complaint on file.    Right hip pain -     Medications and allergies reviewed with patient and updated if appropriate.  Current Outpatient Medications on File Prior to Visit  Medication Sig Dispense Refill   benzonatate (TESSALON) 200 MG capsule Take 1 capsule (200 mg total) by mouth 2 (two) times daily as needed for cough. 20 capsule 0   desvenlafaxine (PRISTIQ) 100 MG 24 hr tablet Take 1 tablet (100 mg total) by mouth daily. 90 tablet 4   fluticasone (FLONASE) 50 MCG/ACT nasal spray daily as needed.     ibuprofen (ADVIL,MOTRIN) 600 MG tablet Take 1 tablet (600 mg total) by mouth every 6 (six) hours as needed. 30 tablet 0   indapamide (LOZOL) 1.25 MG tablet Take 1 tablet (1.25 mg total) by mouth daily. 90 tablet 0   meloxicam (MOBIC) 7.5 MG tablet Take 1 tablet (7.5 mg total) by mouth daily. 90 tablet 3   mometasone (ELOCON) 0.1 % ointment Apply topically daily. 45 g 2   Nutritional Supplements (JUICE PLUS FIBRE PO) Take by mouth.     ondansetron (ZOFRAN ODT) 4 MG disintegrating tablet Take 1 tablet (4 mg total) by mouth every 8 (eight) hours as needed for nausea or vomiting. 20 tablet 2   Potassium Citrate 15 MEQ (1620 MG) TBCR Take 1 tablet by mouth 2 (two) times daily with a meal. 180 tablet 2   Rimegepant Sulfate (NURTEC) 75 MG TBDP Take 75 mg by mouth daily as needed for up to 1 dose (migraine). MDD 1 8 tablet 3   zolmitriptan (ZOMIG) 5 MG tablet Take 1 tablet (5 mg total) by mouth as needed for migraine. May repeat x 1 after 2 hr prn 10 tablet 5   No current facility-administered medications on file prior to visit.    Review of Systems     Objective:  There were no vitals filed for this visit. BP Readings from Last 3 Encounters:  10/30/21 (!) 135/91  05/29/21 112/74  05/16/21 122/76   Wt Readings from Last 3  Encounters:  10/30/21 160 lb 9.6 oz (72.8 kg)  05/16/21 151 lb 3.2 oz (68.6 kg)  02/27/21 145 lb (65.8 kg)   There is no height or weight on file to calculate BMI.    Physical Exam         Assessment & Plan:    See Problem List for Assessment and Plan of chronic medical problems.

## 2022-02-25 ENCOUNTER — Ambulatory Visit: Payer: BC Managed Care – PPO | Admitting: Internal Medicine

## 2022-02-25 ENCOUNTER — Encounter: Payer: Self-pay | Admitting: Internal Medicine

## 2022-02-25 VITALS — BP 130/88 | HR 92 | Temp 98.0°F | Ht 62.0 in | Wt 166.0 lb

## 2022-02-25 DIAGNOSIS — M7631 Iliotibial band syndrome, right leg: Secondary | ICD-10-CM | POA: Insufficient documentation

## 2022-02-25 DIAGNOSIS — N200 Calculus of kidney: Secondary | ICD-10-CM

## 2022-02-25 DIAGNOSIS — J069 Acute upper respiratory infection, unspecified: Secondary | ICD-10-CM | POA: Diagnosis not present

## 2022-02-25 NOTE — Assessment & Plan Note (Signed)
Acute Symptoms c/w IT band syndrome Will refer to PT If not improving can Refer to sports medicine-she will let me know

## 2022-02-25 NOTE — Assessment & Plan Note (Signed)
Acute Symptoms likely viral in nature Continue symptomatic treatment with over-the-counter cold medications, Tylenol/ibuprofen Increase rest and fluids Call if symptoms worsen or do not improve

## 2022-02-25 NOTE — Patient Instructions (Addendum)
        Medications changes include :   none    A referral was ordered for PT.     Someone will call you to schedule an appointment.

## 2022-02-25 NOTE — Assessment & Plan Note (Signed)
Chronic Has had recurrent stones Follows with urology Continue indapamide 1.25 mg daily and potassium chloride 15 mEq twice daily Will check blood work with physical later this year

## 2022-03-10 ENCOUNTER — Other Ambulatory Visit: Payer: Self-pay | Admitting: Nurse Practitioner

## 2022-03-10 DIAGNOSIS — G43109 Migraine with aura, not intractable, without status migrainosus: Secondary | ICD-10-CM

## 2022-04-14 ENCOUNTER — Encounter: Payer: Self-pay | Admitting: Internal Medicine

## 2022-04-15 ENCOUNTER — Other Ambulatory Visit: Payer: Self-pay

## 2022-04-15 MED ORDER — POTASSIUM CITRATE ER 15 MEQ (1620 MG) PO TBCR: 1.0000 | EXTENDED_RELEASE_TABLET | Freq: Two times a day (BID) | ORAL | 2 refills | Status: DC

## 2022-04-26 ENCOUNTER — Encounter: Payer: Self-pay | Admitting: Internal Medicine

## 2022-04-26 NOTE — Progress Notes (Unsigned)
Subjective:    Patient ID: Joy Petty, female    DOB: [birth year], 62 y.o.   MRN: [id number]      HPI Joy Petty is here for a Physical exam.    She denies anything new in her health.  Her anxiety level is high.  He is not very stressed recently.  She does state fatigue.   Medications and allergies reviewed with patient and updated if appropriate.  Current Outpatient Medications on File Prior to Visit  Medication Sig Dispense Refill   desvenlafaxine (PRISTIQ) 100 MG 24 hr tablet Take 1 tablet (100 mg total) by mouth daily. 90 tablet 4   fluticasone (FLONASE) 50 MCG/ACT nasal spray daily as needed.     indapamide (LOZOL) 1.25 MG tablet Take 1 tablet (1.25 mg total) by mouth daily. 90 tablet 0   lamoTRIgine (LAMICTAL) 100 MG tablet Take by mouth.     mometasone (ELOCON) 0.1 % ointment Apply topically daily. 45 g 2   Nutritional Supplements (JUICE PLUS FIBRE PO) Take by mouth.     ondansetron (ZOFRAN ODT) 4 MG disintegrating tablet Take 1 tablet (4 mg total) by mouth every 8 (eight) hours as needed for nausea or vomiting. 20 tablet 2   Potassium Citrate 15 MEQ (1620 MG) TBCR Take 1 tablet by mouth 2 (two) times daily with a meal. 180 tablet 2   UBRELVY 100 MG TABS Take by mouth.     No current facility-administered medications on file prior to visit.    Review of Systems  Constitutional:  Positive for fatigue. Negative for fever.  Eyes:  Negative for visual disturbance.  Respiratory:  Negative for cough, shortness of breath and wheezing.   Cardiovascular:  Negative for chest pain, palpitations and leg swelling.  Gastrointestinal:  Negative for abdominal pain, blood in stool, constipation and diarrhea.       Occ gerd  Genitourinary:  Negative for dysuria.  Musculoskeletal:  Positive for arthralgias (tylenol). Negative for back pain.       Neck, knees , feet  Skin:  Negative for rash.  Neurological:  Positive for numbness (with migraines) and headaches.  Negative for dizziness and light-headedness.  Psychiatric/Behavioral:  Negative for dysphoric mood. The patient is nervous/anxious.        Objective:   Vitals:   04/27/22 1017  BP: 120/72  Pulse: 87  Temp: 98.2 F (36.8 C)  SpO2: 96%   There were no vitals filed for this visit. Body mass index is 30.36 kg/m.  BP Readings from Last 3 Encounters:  04/27/22 120/72  02/25/22 130/88  10/30/21 (!) 135/91    Wt Readings from Last 3 Encounters:  02/25/22 166 lb (75.3 kg)  10/30/21 160 lb 9.6 oz (72.8 kg)  05/16/21 151 lb 3.2 oz (68.6 kg)       Physical Exam Constitutional: She appears well-developed and well-nourished. No distress.  HENT:  Head: Normocephalic and atraumatic.  Right Ear: External ear normal. Normal ear canal and TM Left Ear: External ear normal.  Normal ear canal and TM Mouth/Throat: Oropharynx is clear and moist.  Eyes: Conjunctivae normal.  Neck: Neck supple. No tracheal deviation present. No thyromegaly present.  No carotid bruit  Cardiovascular: Normal rate, regular rhythm and normal heart sounds.   No murmur heard.  No edema. Pulmonary/Chest: Effort normal and breath sounds normal. No respiratory distress. She has no wheezes. She has no rales.  Breast: deferred   Abdominal: Soft. She exhibits no distension. There is no tenderness.  Lymphadenopathy:  She has no cervical adenopathy.  Skin: Skin is warm and dry. She is not diaphoretic.  Psychiatric: She has a normal mood and affect. Her behavior is normal.     Lab Results  Component Value Date   WBC 4.9 02/05/2021   HGB 13.4 02/05/2021   HCT 39.7 02/05/2021   PLT 240.0 02/05/2021   GLUCOSE 85 02/05/2021   CHOL 195 02/05/2021   TRIG 122.0 02/05/2021   HDL 70.30 02/05/2021   LDLCALC 101 (H) 02/05/2021   ALT 20 02/05/2021   AST 23 02/05/2021   NA 136 02/05/2021   K 3.5 02/05/2021   CL 98 02/05/2021   CREATININE 0.62 02/05/2021   BUN 21 02/05/2021   CO2 31 02/05/2021   TSH 2.22 02/05/2021    INR 0.94 03/28/2009   HGBA1C 5.3 02/05/2021         Assessment & Plan:   Physical exam: Screening blood work  ordered Exercise  none-stressed regular exercise Weight  encouraged weight loss Substance abuse  none   Reviewed recommended immunizations.   Health Maintenance  Topic Date Due   COVID-19 Vaccine (4 - 2023-24 season) 05/13/2022 (Originally 10/24/2021)   MAMMOGRAM  11/07/2022   DTaP/Tdap/Td (2 - Td or Tdap) 10/24/2023   PAP SMEAR-Modifier  10/24/2023   COLONOSCOPY (Pts 45-29yr Insurance coverage will need to be confirmed)  02/28/2024   INFLUENZA VACCINE  Completed   Hepatitis C Screening  Completed   Zoster Vaccines- Shingrix  Completed   HPV VACCINES  Aged Out   HIV Screening  Discontinued          See Problem List for Assessment and Plan of chronic medical problems.

## 2022-04-26 NOTE — Patient Instructions (Addendum)
Blood work was ordered.   The lab is on the first floor.    Medications changes include :   none     Return in about 1 year (around 04/27/2023) for Physical Exam.   Health Maintenance, Female Adopting a healthy lifestyle and getting preventive care are important in promoting health and wellness. Ask your health care provider about: The right schedule for you to have regular tests and exams. Things you can do on your own to prevent diseases and keep yourself healthy. What should I know about diet, weight, and exercise? Eat a healthy diet  Eat a diet that includes plenty of vegetables, fruits, low-fat dairy products, and lean protein. Do not eat a lot of foods that are high in solid fats, added sugars, or sodium. Maintain a healthy weight Body mass index (BMI) is used to identify weight problems. It estimates body fat based on height and weight. Your health care provider can help determine your BMI and help you achieve or maintain a healthy weight. Get regular exercise Get regular exercise. This is one of the most important things you can do for your health. Most adults should: Exercise for at least 150 minutes each week. The exercise should increase your heart rate and make you sweat (moderate-intensity exercise). Do strengthening exercises at least twice a week. This is in addition to the moderate-intensity exercise. Spend less time sitting. Even light physical activity can be beneficial. Watch cholesterol and blood lipids Have your blood tested for lipids and cholesterol at 64 years of age, then have this test every 5 years. Have your cholesterol levels checked more often if: Your lipid or cholesterol levels are high. You are older than 64 years of age. You are at high risk for heart disease. What should I know about cancer screening? Depending on your health history and family history, you may need to have cancer screening at various ages. This may include screening  for: Breast cancer. Cervical cancer. Colorectal cancer. Skin cancer. Lung cancer. What should I know about heart disease, diabetes, and high blood pressure? Blood pressure and heart disease High blood pressure causes heart disease and increases the risk of stroke. This is more likely to develop in people who have high blood pressure readings or are overweight. Have your blood pressure checked: Every 3-5 years if you are 51-84 years of age. Every year if you are 18 years old or older. Diabetes Have regular diabetes screenings. This checks your fasting blood sugar level. Have the screening done: Once every three years after age 56 if you are at a normal weight and have a low risk for diabetes. More often and at a younger age if you are overweight or have a high risk for diabetes. What should I know about preventing infection? Hepatitis B If you have a higher risk for hepatitis B, you should be screened for this virus. Talk with your health care provider to find out if you are at risk for hepatitis B infection. Hepatitis C Testing is recommended for: Everyone born from [birth year] through 1967. Anyone with known risk factors for hepatitis C. Sexually transmitted infections (STIs) Get screened for STIs, including gonorrhea and chlamydia, if: You are sexually active and are younger than 64 years of age. You are older than 64 years of age and your health care provider tells you that you are at risk for this type of infection. Your sexual activity has changed since you were last screened, and you are at  increased risk for chlamydia or gonorrhea. Ask your health care provider if you are at risk. Ask your health care provider about whether you are at high risk for HIV. Your health care provider may recommend a prescription medicine to help prevent HIV infection. If you choose to take medicine to prevent HIV, you should first get tested for HIV. You should then be tested every 3 months for as long as you  are taking the medicine. Pregnancy If you are about to stop having your period (premenopausal) and you may become pregnant, seek counseling before you get pregnant. Take 400 to 800 micrograms (mcg) of folic acid every day if you become pregnant. Ask for birth control (contraception) if you want to prevent pregnancy. Osteoporosis and menopause Osteoporosis is a disease in which the bones lose minerals and strength with aging. This can result in bone fractures. If you are 56 years old or older, or if you are at risk for osteoporosis and fractures, ask your health care provider if you should: Be screened for bone loss. Take a calcium or vitamin D supplement to lower your risk of fractures. Be given hormone replacement therapy (HRT) to treat symptoms of menopause. Follow these instructions at home: Alcohol use Do not drink alcohol if: Your health care provider tells you not to drink. You are pregnant, may be pregnant, or are planning to become pregnant. If you drink alcohol: Limit how much you have to: 0-1 drink a day. Know how much alcohol is in your drink. In the [place] one drink equals one 12 oz bottle of beer (355 mL), one 5 oz glass of wine (148 mL), or one 1 oz glass of hard liquor (44 mL). Lifestyle Do not use any products that contain nicotine or tobacco. These products include cigarettes, chewing tobacco, and vaping devices, such as e-cigarettes. If you need help quitting, ask your health care provider. Do not use street drugs. Do not share needles. Ask your health care provider for help if you need support or information about quitting drugs. General instructions Schedule regular health, dental, and eye exams. Stay current with your vaccines. Tell your health care provider if: You often feel depressed. You have ever been abused or do not feel safe at home. Summary Adopting a healthy lifestyle and getting preventive care are important in promoting health and wellness. Follow your  health care provider's instructions about healthy diet, exercising, and getting tested or screened for diseases. Follow your health care provider's instructions on monitoring your cholesterol and blood pressure. This information is not intended to replace advice given to you by your health care provider. Make sure you discuss any questions you have with your health care provider. Document Revised: 07/01/2020 Document Reviewed: 07/01/2020 Elsevier Patient Education  2023 [employer].

## 2022-04-27 ENCOUNTER — Ambulatory Visit (INDEPENDENT_AMBULATORY_CARE_PROVIDER_SITE_OTHER): Payer: BC Managed Care – PPO | Admitting: Internal Medicine

## 2022-04-27 VITALS — BP 120/72 | HR 87 | Temp 98.2°F | Ht 62.0 in

## 2022-04-27 DIAGNOSIS — Z0000 Encounter for general adult medical examination without abnormal findings: Secondary | ICD-10-CM | POA: Diagnosis not present

## 2022-04-27 DIAGNOSIS — F411 Generalized anxiety disorder: Secondary | ICD-10-CM | POA: Diagnosis not present

## 2022-04-27 DIAGNOSIS — G43109 Migraine with aura, not intractable, without status migrainosus: Secondary | ICD-10-CM

## 2022-04-27 DIAGNOSIS — G43909 Migraine, unspecified, not intractable, without status migrainosus: Secondary | ICD-10-CM

## 2022-04-27 DIAGNOSIS — R739 Hyperglycemia, unspecified: Secondary | ICD-10-CM | POA: Diagnosis not present

## 2022-04-27 DIAGNOSIS — N200 Calculus of kidney: Secondary | ICD-10-CM

## 2022-04-27 DIAGNOSIS — R5383 Other fatigue: Secondary | ICD-10-CM

## 2022-04-27 DIAGNOSIS — F3289 Other specified depressive episodes: Secondary | ICD-10-CM | POA: Diagnosis not present

## 2022-04-27 LAB — LIPID PANEL
Cholesterol: 241 mg/dL — ABNORMAL HIGH (ref 0–200)
HDL: 71.6 mg/dL (ref >39.00)
LDL Cholesterol: 138 mg/dL — ABNORMAL HIGH (ref 0–99)
NonHDL: 169.32
Total CHOL/HDL Ratio: 3
Triglycerides: 158 mg/dL — ABNORMAL HIGH (ref 0.0–149.0)
VLDL: 31.6 mg/dL (ref 0.0–40.0)

## 2022-04-27 LAB — CBC WITH DIFFERENTIAL/PLATELET
Basophils Absolute: 0 10*3/uL (ref 0.0–0.1)
Basophils Relative: 1.1 % (ref 0.0–3.0)
Eosinophils Absolute: 0.1 10*3/uL (ref 0.0–0.7)
Eosinophils Relative: 3.2 % (ref 0.0–5.0)
HCT: 42.9 % (ref 36.0–46.0)
Hemoglobin: 14.9 g/dL (ref 12.0–15.0)
Lymphocytes Relative: 29.9 % (ref 12.0–46.0)
Lymphs Abs: 1.3 10*3/uL (ref 0.7–4.0)
MCHC: 34.8 g/dL (ref 30.0–36.0)
MCV: 91 fl (ref 78.0–100.0)
Monocytes Absolute: 0.4 10*3/uL (ref 0.1–1.0)
Monocytes Relative: 8 % (ref 3.0–12.0)
Neutro Abs: 2.6 10*3/uL (ref 1.4–7.7)
Neutrophils Relative %: 57.8 % (ref 43.0–77.0)
Platelets: 261 10*3/uL (ref 150.0–400.0)
RBC: 4.71 Mil/uL (ref 3.87–5.11)
RDW: 12.9 % (ref 11.5–15.5)
WBC: 4.4 10*3/uL (ref 4.0–10.5)

## 2022-04-27 LAB — VITAMIN D 25 HYDROXY (VIT D DEFICIENCY, FRACTURES): VITD: 20.56 ng/mL — ABNORMAL LOW (ref 30.00–100.00)

## 2022-04-27 LAB — COMPREHENSIVE METABOLIC PANEL
ALT: 22 U/L (ref 0–35)
AST: 22 U/L (ref 0–37)
Albumin: 4.4 g/dL (ref 3.5–5.2)
Alkaline Phosphatase: 69 U/L (ref 39–117)
BUN: 20 mg/dL (ref 6–23)
CO2: 32 mEq/L (ref 19–32)
Calcium: 10 mg/dL (ref 8.4–10.5)
Chloride: 99 mEq/L (ref 96–112)
Creatinine, Ser: 0.75 mg/dL (ref 0.40–1.20)
GFR: 84.7 mL/min (ref >60.00)
Glucose, Bld: 87 mg/dL (ref 70–99)
Potassium: 3.8 mEq/L (ref 3.5–5.1)
Sodium: 140 mEq/L (ref 135–145)
Total Bilirubin: 0.7 mg/dL (ref 0.2–1.2)
Total Protein: 7.1 g/dL (ref 6.0–8.3)

## 2022-04-27 LAB — VITAMIN B12: Vitamin B-12: 211 pg/mL (ref 211–911)

## 2022-04-27 LAB — HEMOGLOBIN A1C: Hgb A1c MFr Bld: 5.4 % (ref 4.6–6.5)

## 2022-04-27 LAB — TSH: TSH: 2.61 u[IU]/mL (ref 0.35–5.50)

## 2022-04-27 NOTE — Assessment & Plan Note (Signed)
Chronic Check a1c Low sugar / carb diet Stressed regular exercise  

## 2022-04-27 NOTE — Assessment & Plan Note (Signed)
Chronic Somewhat controlled, but could be better She plans on seeing a therapist She is not interested in making any changes in her medication at this time Encouraged regular exercise Continue Pristiq 100 mg daily

## 2022-04-27 NOTE — Assessment & Plan Note (Signed)
New She is sleeping plenty of hours of sleep, but still has fatigue She has moderate OSA and is using a snore guard.  Discussed this may need to be reevaluated Will check blood work including CBC, CMP, TSH, B12, vitamin D She is not exercising regularly and discussed that the exercise will improve her energy level Was started on Lamictal-that could be contributing to her lower energy or fatigue-she will discuss this with neurology Her anxiety and depression are not ideally controlled which is likely contributing to her fatigue as well Discussed all of the things that could be contributing so that she can start to address them

## 2022-04-27 NOTE — Assessment & Plan Note (Addendum)
Chronic Not ideally controlled She plans on seeing a therapist She is not interested in making any changes in her medication at this time Encouraged regular exercise Continue Pristiq 100 mg daily-prescribed by her gynecologist

## 2022-04-27 NOTE — Assessment & Plan Note (Addendum)
Chronic Following with neurology at [facility] Complicated migraines 3/year Regular migraines 2-3 / month Taking lamotrigine 100 mg daily Taking Ubrelvy 100 mg as needed, Zofran as needed

## 2022-04-27 NOTE — Assessment & Plan Note (Signed)
Chronic Following with urology History of recurrent kidney stones Continue indapamide 1.25 mg daily and potassium citrate 15 mill equivalents twice daily CMP

## 2022-04-27 NOTE — Assessment & Plan Note (Signed)
Chronic Following with neurology at [facility] Has a mixture of complicated and "regular" migraines On Lamictal 100 mg daily, Ubrelvy 100 mg as needed Just had this MRI of the brain Has follow-up with neurology soon

## 2022-05-19 ENCOUNTER — Other Ambulatory Visit: Payer: Self-pay | Admitting: Internal Medicine

## 2022-05-19 MED ORDER — INDAPAMIDE 1.25 MG PO TABS: 1.2500 mg | ORAL_TABLET | Freq: Every day | ORAL | 1 refills | Status: DC

## 2022-05-28 ENCOUNTER — Encounter: Payer: Self-pay | Admitting: Internal Medicine

## 2022-05-28 MED ORDER — ONDANSETRON 4 MG PO TBDP: 4.0000 mg | ORAL_TABLET | Freq: Three times a day (TID) | ORAL | 2 refills | Status: AC | PRN

## 2022-08-17 ENCOUNTER — Encounter: Payer: Self-pay | Admitting: Internal Medicine

## 2022-08-18 NOTE — Telephone Encounter (Signed)
Appt made for 08/21/22 at 9:30am

## 2022-08-20 ENCOUNTER — Encounter: Payer: Self-pay | Admitting: Internal Medicine

## 2022-08-20 NOTE — Progress Notes (Signed)
    Subjective:    Patient ID: Joy Petty, female    DOB: 17-Dec-1960, 62 y.o.   MRN: [id number]      HPI Joy Petty is here for  Chief Complaint  Patient presents with   Medical Management of Chronic Issues    Patient has some concerns about gaining weight, wants to discuss weigh loss medication or options  Concerned about her B12, patient wants to start B12 injections    Weight concerns-wonders about weight loss medication.  She has struggled with there weight since 3rd grade.  Her weight has gone up and done.  She has tried everything and she feels that she needs some help because it is starting to affect her health.  B12 def - interested in injections.  Not exercising now - radiculopathy, knee pain is preventing her from exercising regularly.  She knows she needs to and wants to get back into it when stable.   Medications and allergies reviewed with patient and updated if appropriate.  Current Outpatient Medications on File Prior to Visit  Medication Sig Dispense Refill   desvenlafaxine (PRISTIQ) 100 MG 24 hr tablet Take 1 tablet (100 mg total) by mouth daily. 90 tablet 4   fluticasone (FLONASE) 50 MCG/ACT nasal spray daily as needed.     indapamide (LOZOL) 1.25 MG tablet Take 1 tablet (1.25 mg total) by mouth daily. 90 tablet 1   lamoTRIgine (LAMICTAL) 100 MG tablet Take by mouth.     mometasone (ELOCON) 0.1 % ointment Apply topically daily. 45 g 2   Nutritional Supplements (JUICE PLUS FIBRE PO) Take by mouth.     ondansetron (ZOFRAN ODT) 4 MG disintegrating tablet Take 1 tablet (4 mg total) by mouth every 8 (eight) hours as needed for nausea or vomiting. 20 tablet 2   Potassium Citrate 15 MEQ (1620 MG) TBCR Take 1 tablet by mouth 2 (two) times daily with a meal. 180 tablet 2   UBRELVY 100 MG TABS Take by mouth.     No current facility-administered medications on file prior to visit.    Review of Systems     Objective:   Vitals:   08/21/22 0927   BP: 122/78  Pulse: 80  Temp: 98.2 F (36.8 C)  SpO2: 94%   BP Readings from Last 3 Encounters:  08/21/22 122/78  04/27/22 120/72  02/25/22 130/88   Wt Readings from Last 3 Encounters:  08/21/22 171 lb 6 oz (77.7 kg)  02/25/22 166 lb (75.3 kg)  10/30/21 160 lb 9.6 oz (72.8 kg)   Body mass index is 31.34 kg/m.    Physical Exam Constitutional:      General: She is not in acute distress.    Appearance: Normal appearance. She is not ill-appearing.  HENT:     Head: Normocephalic and atraumatic.  Skin:    General: Skin is warm and dry.  Neurological:     Mental Status: She is alert. Mental status is at baseline.  Psychiatric:        Mood and Affect: Mood normal.        Behavior: Behavior normal.        Thought Content: Thought content normal.        Judgment: Judgment normal.            Assessment & Plan:    See Problem List for Assessment and Plan of chronic medical problems.

## 2022-08-21 ENCOUNTER — Ambulatory Visit: Payer: BC Managed Care – PPO | Admitting: Internal Medicine

## 2022-08-21 ENCOUNTER — Other Ambulatory Visit: Payer: Self-pay | Admitting: Internal Medicine

## 2022-08-21 VITALS — BP 122/78 | HR 80 | Temp 98.2°F | Ht 62.0 in | Wt 171.4 lb

## 2022-08-21 DIAGNOSIS — E669 Obesity, unspecified: Secondary | ICD-10-CM | POA: Diagnosis not present

## 2022-08-21 DIAGNOSIS — E538 Deficiency of other specified B group vitamins: Secondary | ICD-10-CM | POA: Diagnosis not present

## 2022-08-21 DIAGNOSIS — Z6831 Body mass index (BMI) 31.0-31.9, adult: Secondary | ICD-10-CM | POA: Diagnosis not present

## 2022-08-21 MED ORDER — WEGOVY 0.25 MG/0.5ML ~~LOC~~ SOAJ: 0.2500 mg | SUBCUTANEOUS | 0 refills | Status: DC

## 2022-08-21 NOTE — Telephone Encounter (Signed)
Alternative Requested:WEIGHT LOSS DRUGS NOT COVERED BY INSURANCE, YOU CAN TRY TO CALL FOR PRIOR AUTH [phone] [id number].

## 2022-08-21 NOTE — Assessment & Plan Note (Signed)
Chronic BMI today 31.34 kg/m Has dealt with weight issues most of her life and is struggling to keep her weight down She does have lumbar radiculopathy, chronic knee pain, sleep apnea, depression and anxiety Would like some assistance to help her get some of the weight off Stressed regular exercise-has appoint with orthopedics to evaluate her knee and hopefully with improvement and that she will be able to exercise on Stressed decrease portions, healthy diet Stressed that no matter what medication she is able to get if any lifestyle changes are still necessary for long-term success in her weight loss Will see if we get Wegovy covered by her insurance-start 0.25 mg weekly Reviewed possible side effects including constipation, nausea, diarrhea, GERD and risk of pancreatitis.  Discussed rare risk thyroid cancer in mice Discussed how medication works Follow-up will depend on if medication is not covered

## 2022-08-21 NOTE — Patient Instructions (Addendum)
     You received a B12 injection today.     Medications changes include :   wegovy 0.25 mg weekly

## 2022-08-21 NOTE — Assessment & Plan Note (Signed)
Recent B12 level 211 She has been trying to do some oral supplementation, but keeps forgetting and she is tired and is wondering if that is affecting her energy level Will start monthly B12 injections and then can reevaluate B12 injection today

## 2022-08-24 ENCOUNTER — Telehealth: Payer: Self-pay

## 2022-08-24 ENCOUNTER — Other Ambulatory Visit (HOSPITAL_COMMUNITY): Payer: Self-pay

## 2022-08-24 NOTE — Telephone Encounter (Addendum)
Pharmacy Patient Advocate Encounter   Received notification that prior authorization for Wegovy 0.25mg /0.23ml is required/requested.   PA submitted to [employer] via [employer]  # [id number] Status is pending

## 2022-08-24 NOTE — Telephone Encounter (Signed)
Request sent to PA team to complete prior authorization.

## 2022-08-24 NOTE — Telephone Encounter (Signed)
Pharmacy Patient Advocate Encounter  Received notification from [facility] that the request for prior authorization for Wegovy 0.25mg /0.46ml has been denied due to this health benefit plan does not cover the following services, supplies, drugs or charges: any treatment or regimen, medical or surgical, for the purpose of reducing or controlling the weight of the member, or for the treatment of obesity except for surgical treatment of morbid obesity, or as specifically covered by this health benefit plan.    Please be advised we currently do not have a Pharmacist to review denials, therefore you will need to process appeals accordingly as needed. Thanks for your support at this time.  Denial letter indexed to chart

## 2022-08-26 NOTE — Telephone Encounter (Signed)
Patient notified.  Insurance does not cover any weight loss medication

## 2022-09-08 NOTE — Telephone Encounter (Signed)
PA completed.

## 2022-09-24 ENCOUNTER — Encounter: Payer: Self-pay | Admitting: Internal Medicine

## 2022-09-24 MED ORDER — "SYRINGE/NEEDLE (DISP) 25G X 1"" 3 ML MISC": 0 refills | Status: AC

## 2022-09-24 MED ORDER — CYANOCOBALAMIN 1000 MCG/ML IJ SOLN: 1000.0000 ug | INTRAMUSCULAR | 3 refills | Status: DC

## 2022-09-25 ENCOUNTER — Ambulatory Visit (INDEPENDENT_AMBULATORY_CARE_PROVIDER_SITE_OTHER): Payer: BC Managed Care – PPO | Admitting: Radiology

## 2022-09-25 DIAGNOSIS — E538 Deficiency of other specified B group vitamins: Secondary | ICD-10-CM | POA: Diagnosis not present

## 2022-09-25 MED ORDER — CYANOCOBALAMIN 1000 MCG/ML IJ SOLN
1000.0000 ug | Freq: Once | INTRAMUSCULAR | Status: AC
Start: 2022-09-25 — End: 2022-09-25
  Administered 2022-09-25: 1000 ug via INTRAMUSCULAR

## 2022-09-25 NOTE — Progress Notes (Signed)
Pt here for monthly/weekly B12 injection   B12 given IM, and pt tolerated injection well.  Pt also here for self administering teaching. Pt did well and understood with no further questions.

## 2022-11-15 ENCOUNTER — Other Ambulatory Visit: Payer: Self-pay | Admitting: Internal Medicine

## 2022-11-17 ENCOUNTER — Ambulatory Visit (HOSPITAL_BASED_OUTPATIENT_CLINIC_OR_DEPARTMENT_OTHER): Payer: BC Managed Care – PPO | Admitting: Obstetrics & Gynecology

## 2022-11-18 ENCOUNTER — Ambulatory Visit (HOSPITAL_BASED_OUTPATIENT_CLINIC_OR_DEPARTMENT_OTHER): Payer: BC Managed Care – PPO | Admitting: Obstetrics & Gynecology

## 2022-12-06 ENCOUNTER — Other Ambulatory Visit (HOSPITAL_BASED_OUTPATIENT_CLINIC_OR_DEPARTMENT_OTHER): Payer: Self-pay | Admitting: Obstetrics & Gynecology

## 2022-12-06 DIAGNOSIS — Z8659 Personal history of other mental and behavioral disorders: Secondary | ICD-10-CM

## 2022-12-06 NOTE — Telephone Encounter (Signed)
Ran out of medications for Pristiq, pending appt with Dr. Hyacinth Meeker on 12/15/22.  Rx sent for 1 month.

## 2022-12-15 ENCOUNTER — Encounter (HOSPITAL_BASED_OUTPATIENT_CLINIC_OR_DEPARTMENT_OTHER): Payer: Self-pay | Admitting: Obstetrics & Gynecology

## 2022-12-15 ENCOUNTER — Ambulatory Visit (INDEPENDENT_AMBULATORY_CARE_PROVIDER_SITE_OTHER): Payer: BC Managed Care – PPO | Admitting: Obstetrics & Gynecology

## 2022-12-15 VITALS — BP 129/87 | HR 85 | Ht 61.75 in | Wt 172.8 lb

## 2022-12-15 DIAGNOSIS — Z01419 Encounter for gynecological examination (general) (routine) without abnormal findings: Secondary | ICD-10-CM

## 2022-12-15 DIAGNOSIS — Z23 Encounter for immunization: Secondary | ICD-10-CM

## 2022-12-15 DIAGNOSIS — G43109 Migraine with aura, not intractable, without status migrainosus: Secondary | ICD-10-CM | POA: Diagnosis not present

## 2022-12-15 DIAGNOSIS — Z780 Asymptomatic menopausal state: Secondary | ICD-10-CM

## 2022-12-15 DIAGNOSIS — Z8659 Personal history of other mental and behavioral disorders: Secondary | ICD-10-CM

## 2022-12-15 MED ORDER — DESVENLAFAXINE SUCCINATE ER 100 MG PO TB24: 100.0000 mg | ORAL_TABLET | Freq: Every day | ORAL | Status: DC

## 2022-12-15 NOTE — Patient Instructions (Signed)
[email or web address]

## 2022-12-15 NOTE — Progress Notes (Signed)
62 y.o. [id number] Married White or Caucasian female here for annual exam.  Has a lot going on right now.  Doesn't really want to talk about it today.  Having a lot of issues with headaches.  Is on Ubrelvy and on lamotrigine.  Has neurologist in [place] as she had a complicated migraine while at her home at [facility].  Got connected to neurology there.  Would like to have local neurologist.  Has had two migraines like this and is now worried almost anything she does will trigger another one of these migraines.    She is seeing a new therapist.  She is on prestiq.   Patient's last menstrual period was 02/23/2009.             Health Maintenance: Pap:  10/23/2020 History of abnormal Pap:  no MMG:  10/2020 BMD: 2020, mild osteopenia Colonoscopy:  12024, follow up 3 years Screening Labs: does with Cheryll Cockayne   reports that she has never smoked. She has never used smokeless tobacco. She reports current alcohol use of about 7.0 standard drinks of alcohol per week. She reports that she does not use drugs.  Past Medical History:  Diagnosis Date   Anxiety    Depression    anxiety   Insomnia    Kidney stones    multiple-4/16   Migraines    complex   Post-operative nausea and vomiting    SUI (stress urinary incontinence, female)     Past Surgical History:  Procedure Laterality Date   COLONOSCOPY     10 years ago in [place], [place]-normal exam   CYSTOSCOPY/RETROGRADE/URETEROSCOPY/STONE EXTRACTION WITH BASKET  05/2014   Dr. Shelby Dubin   FOOT FRACTURE SURGERY Right 2019   FOOT SURGERY  2005   right foot   LAPAROSCOPIC APPENDECTOMY  2006   LITHOTRIPSY  05/2014   Dr. Shelby Dubin   URETHRAL SLING  03/2009   Dr Sherron Monday     Current Outpatient Medications  Medication Sig Dispense Refill   cyanocobalamin (VITAMIN B12) 1000 MCG/ML injection Inject 1 mL (1,000 mcg total) into the muscle every 30 (thirty) days. 3 mL 3   desvenlafaxine (PRISTIQ) 100 MG 24 hr tablet TAKE 1 TABLET BY MOUTH  DAILY 30 tablet 0   fluticasone (FLONASE) 50 MCG/ACT nasal spray daily as needed.     indapamide (LOZOL) 1.25 MG tablet TAKE 1 TABLET BY MOUTH DAILY 90 tablet 1   lamoTRIgine (LAMICTAL) 100 MG tablet Take by mouth.     mometasone (ELOCON) 0.1 % ointment Apply topically daily. 45 g 2   ondansetron (ZOFRAN ODT) 4 MG disintegrating tablet Take 1 tablet (4 mg total) by mouth every 8 (eight) hours as needed for nausea or vomiting. 20 tablet 2   Potassium Citrate 15 MEQ (1620 MG) TBCR Take 1 tablet by mouth 2 (two) times daily with a meal. 180 tablet 2   SYRINGE-NEEDLE, DISP, 3 ML 25G X 1" 3 ML MISC Use as directed for B12 injections 12 each 0   UBRELVY 100 MG TABS Take by mouth.     No current facility-administered medications for this visit.    Family History  Problem Relation Age of Onset   Hypertension Mother    Thyroid disease Mother    Lung cancer Mother    Throat cancer Mother    Heart disease Father    Heart disease Brother    Kidney cancer Brother    Prostate cancer Brother    Diabetes Paternal Grandfather  Diabetes Other    Colon cancer Neg Hx    Colon polyps Neg Hx    Esophageal cancer Neg Hx    Stomach cancer Neg Hx    Rectal cancer Neg Hx     ROS: Constitutional: negative Genitourinary:negative  Exam:   BP 129/87 (BP Location: Right Arm, Patient Position: Sitting, Cuff Size: Large)   Pulse 85   Ht 5' 1.75" (1.568 m)   Wt 172 lb 12.8 oz (78.4 kg)   LMP 02/23/2009   BMI 31.86 kg/m   Height: 5' 1.75" (156.8 cm)  General appearance: alert, cooperative and appears stated age Head: Normocephalic, without obvious abnormality, atraumatic Neck: no adenopathy, supple, symmetrical, trachea midline and thyroid normal to inspection and palpation Lungs: clear to auscultation bilaterally Breasts: normal appearance, no masses or tenderness Heart: regular rate and rhythm Abdomen: soft, non-tender; bowel sounds normal; no masses,  no organomegaly Extremities: extremities  normal, atraumatic, no cyanosis or edema Skin: Skin color, texture, turgor normal. No rashes or lesions Lymph nodes: Cervical, supraclavicular, and axillary nodes normal. No abnormal inguinal nodes palpated Neurologic: Grossly normal   Pelvic: External genitalia:  no lesions              Urethra:  normal appearing urethra with no masses, tenderness or lesions              Bartholins and Skenes: normal                 Vagina: normal appearing vagina with normal color and no discharge, no lesions              Cervix: no lesions              Pap taken: No. Bimanual Exam:  Uterus:  normal size, contour, position, consistency, mobility, non-tender              Adnexa: normal adnexa and no mass, fullness, tenderness               Rectovaginal: Confirms               Anus:  normal sphincter tone, no lesions   Assessment/Plan: 1. Well woman exam with routine gynecological exam - Pap smear 09/2020.  Will repeat next year. - Mammogram 10/2020.  Pt aware this is due.  She did not have one done last yaer. - Colonoscopy 02/2022, follow up 3 years - Bone mineral density 2020.  Will repeat next year - lab work done with PCP, Dr. Lawerance Bach - vaccines reviewed/updated  2. Influenza vaccination administered at current visit - Flu vaccine trivalent PF, 6mos and older(Flulaval,Afluria,Fluarix,Fluzone)  3. Complicated migraine - Ambulatory referral to Neurology  4. Postmenopausal  5. History of depression - pt is seeing new therapist.  Other options discussed - desvenlafaxine (PRISTIQ) 100 MG 24 hr tablet; Take 1 tablet (100 mg total) by mouth daily.  Dispense: 90 tablet; Refill: 03

## 2022-12-17 ENCOUNTER — Encounter (HOSPITAL_BASED_OUTPATIENT_CLINIC_OR_DEPARTMENT_OTHER): Payer: Self-pay | Admitting: Obstetrics & Gynecology

## 2022-12-21 NOTE — Telephone Encounter (Signed)
Called pt in response to [employer]. Advised that Dr. Hyacinth Meeker recommends that she reach out to her PCP regarding her sleep issues or we can refer her to neurology. Pt reports that she has already made appt with neurology. Pt will call with any other concerns.

## 2023-02-13 ENCOUNTER — Other Ambulatory Visit: Payer: Self-pay | Admitting: Internal Medicine

## 2023-02-18 ENCOUNTER — Other Ambulatory Visit: Payer: Self-pay | Admitting: Internal Medicine

## 2023-02-24 HISTORY — PX: FOOT SURGERY: SHX648

## 2023-03-07 ENCOUNTER — Encounter: Payer: Self-pay | Admitting: Internal Medicine

## 2023-03-09 NOTE — Telephone Encounter (Signed)
 Patient coming in for an appointment on 1/15

## 2023-03-10 ENCOUNTER — Ambulatory Visit: Payer: BC Managed Care – PPO | Admitting: Internal Medicine

## 2023-03-10 ENCOUNTER — Encounter: Payer: Self-pay | Admitting: Internal Medicine

## 2023-03-10 VITALS — BP 128/88 | HR 89 | Temp 98.0°F | Ht 61.75 in | Wt 174.0 lb

## 2023-03-10 DIAGNOSIS — J22 Unspecified acute lower respiratory infection: Secondary | ICD-10-CM | POA: Diagnosis not present

## 2023-03-10 MED ORDER — PROMETHAZINE-DM 6.25-15 MG/5ML PO SYRP
5.0000 mL | ORAL_SOLUTION | Freq: Four times a day (QID) | ORAL | 0 refills | Status: AC | PRN
Start: 2023-03-10 — End: ?

## 2023-03-10 MED ORDER — AZITHROMYCIN 250 MG PO TABS: ORAL_TABLET | ORAL | 0 refills | Status: AC

## 2023-03-10 NOTE — Assessment & Plan Note (Signed)
 Subacute Symptoms have been going on for several weeks and she now has a prolonged illness She is experiencing a productive cough of discolored mucus Start Z-Pak, promethazine -DM cough syrup Hopefully this will take care of it, but if not I will probably give her a second antibiotic given the duration of her symptoms Over-the-counter symptom relief Rest, fluids Call if no improvement

## 2023-03-10 NOTE — Progress Notes (Signed)
 Subjective:    Patient ID: Joy Petty, female    DOB: 1960/12/11, 63 y.o.   MRN: [id number]      HPI Joy Petty is here for  Chief Complaint  Patient presents with   Cough    Cough x 4 weeks    She is here for an acute visit for cold symptoms.   Her symptoms started 6 weeks ago.  It seemed to go away for 5 days but came back worse  She is experiencing productive cough of grayish mucus which is affecting her sleep and making her very fatigued.  She has some mild nasal congestion, PND.  She initially had a sore throat but that has resolved.  She denies any fevers, shortness of breath or wheezing.  She has tried taking  tessalon  perles ( did not help), delsym - helped some  She was hoping the cough would just go away, but it is not   Medications and allergies reviewed with patient and updated if appropriate.  Current Outpatient Medications on File Prior to Visit  Medication Sig Dispense Refill   cyanocobalamin  (VITAMIN B12) 1000 MCG/ML injection Inject 1 mL (1,000 mcg total) into the muscle every 30 (thirty) days. 3 mL 3   desvenlafaxine  (PRISTIQ ) 100 MG 24 hr tablet Take 1 tablet (100 mg total) by mouth daily. 90 tablet 03   fluticasone (FLONASE) 50 MCG/ACT nasal spray daily as needed.     lamoTRIgine (LAMICTAL) 100 MG tablet Take by mouth.     mometasone  (ELOCON ) 0.1 % ointment Apply topically daily. 45 g 2   ondansetron  (ZOFRAN  ODT) 4 MG disintegrating tablet Take 1 tablet (4 mg total) by mouth every 8 (eight) hours as needed for nausea or vomiting. 20 tablet 2   Potassium Citrate  15 MEQ (1620 MG) TBCR TAKE 1 TABLET BY MOUTH TWICE A DAY WITH A MEAL 180 tablet 2   SYRINGE-NEEDLE, DISP, 3 ML 25G X 1" 3 ML MISC Use as directed for B12 injections 12 each 0   UBRELVY 100 MG TABS Take by mouth.     No current facility-administered medications on file prior to visit.    Review of Systems  Constitutional:  Positive for fatigue. Negative for chills and fever.   HENT:  Positive for congestion (mild), postnasal drip and sore throat (initially  - resolved). Negative for ear pain, sinus pressure and sinus pain.   Respiratory:  Positive for cough (dry and wet). Negative for chest tightness, shortness of breath and wheezing.   Musculoskeletal:  Negative for myalgias.  Neurological:  Negative for dizziness and headaches.  Psychiatric/Behavioral:  Positive for sleep disturbance (from cough).        Objective:   Vitals:   03/10/23 1512  BP: 128/88  Pulse: 89  Temp: 98 F (36.7 C)  SpO2: 97%   BP Readings from Last 3 Encounters:  03/10/23 128/88  12/15/22 129/87  08/21/22 122/78   Wt Readings from Last 3 Encounters:  03/10/23 174 lb (78.9 kg)  12/15/22 172 lb 12.8 oz (78.4 kg)  08/21/22 171 lb 6 oz (77.7 kg)   Body mass index is 32.08 kg/m.    Physical Exam Constitutional:      General: She is not in acute distress.    Appearance: Normal appearance. She is not ill-appearing.  HENT:     Head: Normocephalic and atraumatic.     Right Ear: Tympanic membrane, ear canal and external ear normal.     Left Ear: Tympanic membrane, ear canal  and external ear normal.     Mouth/Throat:     Mouth: Mucous membranes are moist.     Pharynx: No oropharyngeal exudate or posterior oropharyngeal erythema.  Eyes:     Conjunctiva/sclera: Conjunctivae normal.  Cardiovascular:     Rate and Rhythm: Normal rate and regular rhythm.  Pulmonary:     Effort: Pulmonary effort is normal. No respiratory distress.     Breath sounds: Normal breath sounds. No wheezing or rales.  Musculoskeletal:     Cervical back: Neck supple. No tenderness.  Lymphadenopathy:     Cervical: No cervical adenopathy.  Skin:    General: Skin is warm and dry.  Neurological:     Mental Status: She is alert.            Assessment & Plan:    See Problem List for Assessment and Plan of chronic medical problems.

## 2023-03-10 NOTE — Patient Instructions (Addendum)
     Medications changes include :   zpak, promethazine - dm syrup      Return if symptoms worsen or fail to improve.

## 2023-04-02 ENCOUNTER — Encounter (HOSPITAL_BASED_OUTPATIENT_CLINIC_OR_DEPARTMENT_OTHER): Payer: Self-pay | Admitting: Obstetrics & Gynecology

## 2023-04-14 ENCOUNTER — Ambulatory Visit: Payer: BC Managed Care – PPO | Admitting: Neurology

## 2023-04-16 ENCOUNTER — Ambulatory Visit
Admission: RE | Admit: 2023-04-16 | Discharge: 2023-04-16 | Disposition: A | Discharge status: Home / Self Care | Payer: BC Managed Care – PPO | Source: Ambulatory Visit | Attending: Family Medicine | Admitting: Family Medicine

## 2023-04-16 ENCOUNTER — Other Ambulatory Visit: Payer: Self-pay

## 2023-04-16 VITALS — BP 136/86 | HR 112 | Temp 98.9°F | Resp 20 | Wt 170.0 lb

## 2023-04-16 DIAGNOSIS — Z87442 Personal history of urinary calculi: Secondary | ICD-10-CM | POA: Insufficient documentation

## 2023-04-16 DIAGNOSIS — R519 Headache, unspecified: Secondary | ICD-10-CM | POA: Insufficient documentation

## 2023-04-16 DIAGNOSIS — R319 Hematuria, unspecified: Secondary | ICD-10-CM

## 2023-04-16 DIAGNOSIS — N200 Calculus of kidney: Secondary | ICD-10-CM | POA: Diagnosis present

## 2023-04-16 DIAGNOSIS — R509 Fever, unspecified: Secondary | ICD-10-CM | POA: Diagnosis present

## 2023-04-16 DIAGNOSIS — N390 Urinary tract infection, site not specified: Secondary | ICD-10-CM

## 2023-04-16 LAB — POCT URINALYSIS DIP (MANUAL ENTRY)
Bilirubin, UA: NEGATIVE
Glucose, UA: NEGATIVE mg/dL
Ketones, POC UA: NEGATIVE mg/dL
Nitrite, UA: NEGATIVE
Spec Grav, UA: 1.015 (ref 1.010–1.025)
Urobilinogen, UA: 0.2 U/dL
pH, UA: 7 (ref 5.0–8.0)

## 2023-04-16 LAB — POCT INFLUENZA A/B
Influenza A, POC: NEGATIVE
Influenza B, POC: NEGATIVE

## 2023-04-16 MED ORDER — CEPHALEXIN 500 MG PO CAPS: 500.0000 mg | ORAL_CAPSULE | Freq: Two times a day (BID) | ORAL | 0 refills | Status: AC

## 2023-04-16 NOTE — ED Provider Notes (Signed)
 [facility]    CSN: [id number] Arrival date & time: 04/16/23  1114      History   Chief Complaint Chief Complaint  Patient presents with   Fever    Body Aches and headache - Entered by patient    HPI [facility] is a 63 y.o. female.   The history is provided by the patient.  Fever Passed a kidney stone 3 days ago, that day she developed body aches and fatigue associated with fever.  Has been taking Tylenol with some relief.  Home COVID test 2 days ago was negative. Denies rhinorrhea, nasal congestion, sore throat, cough, abdominal pain, nausea, vomiting, dysuria, frequency, urgency, hematuria, back or flank pain, abdominal pain, dizziness or lightheadedness, headache  Past Medical History:  Diagnosis Date   Anxiety    Depression    anxiety   Insomnia    Kidney stones    multiple-4/16   Migraines    complex   Post-operative nausea and vomiting    SUI (stress urinary incontinence, female)     Patient Active Problem List   Diagnosis Date Noted   Lower respiratory tract infection 03/10/2023   B12 deficiency 08/21/2022   Fatigue 04/27/2022   It band syndrome, right 02/25/2022   Snoring 02/05/2021   Sleep apnea, moderate-snore guard 02/05/2021   Postmenopausal 10/25/2020   Complicated migraine 05/29/2020   Migraine headache 05/29/2020   Elevated LFTs 01/22/2020   Hyperglycemia 12/13/2019   Sleep difficulties 12/13/2019   Fatty liver, [place] 11/2018 12/15/2018   Cholelithiasis 12/15/2018   Foot pain, bilateral 11/29/2018   Colicky RUQ abdominal pain 11/29/2018   Obesity (BMI 30.0-34.9) 11/29/2018   Lichen sclerosus 01/13/2015   Depression 01/13/2015   Generalized anxiety disorder 01/13/2015   Nephrolithiasis 06/20/2014   Family history of heart attack 01/15/2012    Past Surgical History:  Procedure Laterality Date   COLONOSCOPY     10 years ago in [place], -normal exam   CYSTOSCOPY/RETROGRADE/URETEROSCOPY/STONE EXTRACTION WITH  BASKET  05/2014   Dr. Shelby Dubin   FOOT FRACTURE SURGERY Right 2019   FOOT SURGERY  2005   right foot   LAPAROSCOPIC APPENDECTOMY  2006   LITHOTRIPSY  05/2014   Dr. Shelby Dubin   URETHRAL SLING  03/2009   Dr Sherron Monday     OB History     Gravida  3   Para  2   Term      Preterm      AB  1   Living  2      SAB      IAB      Ectopic      Multiple      Live Births               Home Medications    Prior to Admission medications   Medication Sig Start Date End Date Taking? Authorizing Provider  cyanocobalamin (VITAMIN B12) 1000 MCG/ML injection Inject 1 mL (1,000 mcg total) into the muscle every 30 (thirty) days. 09/24/22   Pincus Sanes, MD  desvenlafaxine (PRISTIQ) 100 MG 24 hr tablet Take 1 tablet (100 mg total) by mouth daily. 12/15/22   Jerene Bears, MD  fluticasone (FLONASE) 50 MCG/ACT nasal spray daily as needed. 09/17/15   [provider]  lamoTRIgine (LAMICTAL) 100 MG tablet Take by mouth. 01/06/22   [provider]  mometasone (ELOCON) 0.1 % ointment Apply topically daily. 10/30/21   Jerene Bears, MD  ondansetron (ZOFRAN ODT)  4 MG disintegrating tablet Take 1 tablet (4 mg total) by mouth every 8 (eight) hours as needed for nausea or vomiting. 05/28/22   Pincus Sanes, MD  Potassium Citrate 15 MEQ (1620 MG) TBCR TAKE 1 TABLET BY MOUTH TWICE A DAY WITH A MEAL 02/15/23   Burns, Bobette Mo, MD  promethazine-dextromethorphan (PROMETHAZINE-DM) 6.25-15 MG/5ML syrup Take 5 mLs by mouth 4 (four) times daily as needed. 03/10/23   Burns, Bobette Mo, MD  SYRINGE-NEEDLE, DISP, 3 ML 25G X 1" 3 ML MISC Use as directed for B12 injections 09/24/22   Burns, Bobette Mo, MD  UBRELVY 100 MG TABS Take by mouth.    [provider]    Family History Family History  Problem Relation Age of Onset   Hypertension Mother    Thyroid disease Mother    Lung cancer Mother    Throat cancer Mother    Heart disease Father    Heart disease Brother    Kidney cancer  Brother    Prostate cancer Brother    Diabetes Paternal Grandfather    Diabetes Other    Colon cancer Neg Hx    Colon polyps Neg Hx    Esophageal cancer Neg Hx    Stomach cancer Neg Hx    Rectal cancer Neg Hx     Social History Social History   Tobacco Use   Smoking status: Never   Smokeless tobacco: Never  Vaping Use   Vaping status: Never Used  Substance Use Topics   Alcohol use: Yes    Alcohol/week: 7.0 standard drinks of alcohol    Types: 7 Standard drinks or equivalent per week   Drug use: No     Allergies   Amoxicillin-pot clavulanate, Codeine, Hydrocodone, Oxycodone, and Sulfa antibiotics   Review of Systems Review of Systems  Constitutional:  Positive for fever.     Physical Exam Triage Vital Signs ED Triage Vitals  Encounter Vitals Group     BP 04/16/23 1121 136/86     Systolic BP Percentile --      Diastolic BP Percentile --      Pulse Rate 04/16/23 1121 (!) 112     Resp 04/16/23 1121 20     Temp 04/16/23 1121 98.9 F (37.2 C)     Temp Source 04/16/23 1121 Oral     SpO2 04/16/23 1121 96 %     Weight 04/16/23 1131 170 lb (77.1 kg)     Height --      Head Circumference --      Peak Flow --      Pain Score 04/16/23 1130 7     Pain Loc --      Pain Education --      Exclude from Growth Chart --    No data found.  Updated Vital Signs BP 136/86 (BP Location: Right Arm)   Pulse (!) 112   Temp 98.9 F (37.2 C) (Oral)   Resp 20   Wt 170 lb (77.1 kg)   LMP 02/23/2009   SpO2 96%   BMI 31.35 kg/m   Visual Acuity Right Eye Distance:   Left Eye Distance:   Bilateral Distance:    Right Eye Near:   Left Eye Near:    Bilateral Near:     Physical Exam Vitals and nursing note reviewed.  Constitutional:      Appearance: She is not ill-appearing.  HENT:     Head: Normocephalic and atraumatic.     Right Ear: Tympanic membrane  and ear canal normal.     Left Ear: Tympanic membrane and ear canal normal.     Nose: No rhinorrhea.      Mouth/Throat:     Pharynx: Oropharynx is clear.  Eyes:     Conjunctiva/sclera: Conjunctivae normal.  Cardiovascular:     Rate and Rhythm: Normal rate and regular rhythm.     Heart sounds: Normal heart sounds.  Pulmonary:     Effort: Pulmonary effort is normal.  Abdominal:     General: Bowel sounds are normal.     Palpations: Abdomen is soft.     Tenderness: There is no abdominal tenderness. There is no right CVA tenderness or left CVA tenderness.  Musculoskeletal:     Cervical back: Neck supple.  Lymphadenopathy:     Cervical: No cervical adenopathy.  Skin:    General: Skin is warm and dry.  Neurological:     Mental Status: She is alert and oriented to person, place, and time.      UC Treatments / Results  Labs (all labs ordered are listed, but only abnormal results are displayed) Labs Reviewed  POCT URINALYSIS DIP (MANUAL ENTRY) - Abnormal; Notable for the following components:      Result Value   Color, UA straw (*)    Blood, UA small (*)    Protein Ur, POC trace (*)    Leukocytes, UA Small (1+) (*)    All other components within normal limits  POCT INFLUENZA A/B    EKG   Radiology No results found.  Procedures Procedures (including critical care time)  Medications Ordered in UC Medications - No data to display  Initial Impression / Assessment and Plan / UC Course  I have reviewed the triage vital signs and the nursing notes.  Pertinent labs & imaging results that were available during my care of the patient were reviewed by me and considered in my medical decision making (see chart for details).     63 year old reports fever body aches and fatigue for 2 days after passing a kidney stone.  No respiratory symptoms no GI symptoms.  She is well-appearing, afebrile, well-hydrated.  CV a or abdominal tenderness.Home COVID test was -2 days.  Point-of-care flu test is negative.  Point-of-care urinalysis with small blood and small leuks will treat for UTI pending  urine culture Final Clinical Impressions(s) / UC Diagnoses   Final diagnoses:  None   Discharge Instructions   None    ED Prescriptions   None    PDMP not reviewed this encounter.   Meliton Rattan, [place] 04/16/23 1200

## 2023-04-16 NOTE — ED Triage Notes (Addendum)
 Pt presents with complaints of generalized body aches, fevers, and headaches that began on Tuesday night, 2/18, after passing a kidney stone. Pt states she has been taking Tylenol every 8 hours with noticeable improvement. Pt currently rates her overall pain a 7/10. Pt reports she took an at-home COVID test on Wednesday morning 2/19 and it was negative.

## 2023-04-16 NOTE — Discharge Instructions (Addendum)
 If your urine culture is normal we will contact you and for the antibiotics Follow-up for new, worsening symptoms or concerns

## 2023-04-18 LAB — URINE CULTURE: Culture: >=100000 — AB

## 2023-04-19 ENCOUNTER — Encounter: Payer: Self-pay | Admitting: Internal Medicine

## 2023-04-19 ENCOUNTER — Telehealth (HOSPITAL_COMMUNITY): Payer: Self-pay

## 2023-04-19 MED ORDER — NITROFURANTOIN MONOHYD MACRO 100 MG PO CAPS: 100.0000 mg | ORAL_CAPSULE | Freq: Two times a day (BID) | ORAL | 0 refills | Status: DC

## 2023-04-19 MED ORDER — ONDANSETRON HCL 4 MG PO TABS: 4.0000 mg | ORAL_TABLET | Freq: Three times a day (TID) | ORAL | 0 refills | Status: AC | PRN

## 2023-04-19 NOTE — Telephone Encounter (Signed)
-----   Message from Zenia Resides sent at 04/19/2023  2:26 PM EST ----- Oxacillin and keflex are equivalent in what they cover, so most likely resistant to keflex too. Looks like doxycycline 100 mg BID for 7 days would treat. ----- Message ----- From: Warren Danes, RN Sent: 04/19/2023   1:38 PM EST To: Rushie Chestnut, PA-C; #  Hello, pt went home on Keflex, which was not on sensitivity report. Please advise if changes are needed. Thank you

## 2023-04-19 NOTE — Telephone Encounter (Signed)
 TC to pt, who requested IM medication d/t concern for nausea/dizziness with Doxy.  Per P. Banister, MD, "there is not a one time shot that would adequately treat her infection. Nitrofurantoin is supposed to work according to the culture, and is less likely to make her nauseated. Please send in nitrofurantoin 100 mg BID x 5 days, cancel doxy, and also send in #10 zofran odt."  Discussed with pt, who is agreeable. Reviewed with patient, verified pharmacy, prescription sent

## 2023-05-04 ENCOUNTER — Ambulatory Visit: Payer: BC Managed Care – PPO | Admitting: Neurology

## 2023-05-04 ENCOUNTER — Encounter: Payer: Self-pay | Admitting: Neurology

## 2023-05-04 VITALS — BP 126/81 | HR 77 | Ht 62.0 in | Wt 173.0 lb

## 2023-05-04 DIAGNOSIS — G43709 Chronic migraine without aura, not intractable, without status migrainosus: Secondary | ICD-10-CM | POA: Diagnosis not present

## 2023-05-04 MED ORDER — EMGALITY 120 MG/ML ~~LOC~~ SOAJ: 120.0000 mg | SUBCUTANEOUS | 0 refills | Status: DC

## 2023-05-04 NOTE — Progress Notes (Unsigned)
 [facility]    Provider:  Dr Lucia Gaskins Requesting Provider: Jerene Bears, MD Primary Care Provider:  Pincus Sanes, MD  CC:  Migraines  HPI:  Joy Petty is a 63 y.o. female here as requested by Jerene Bears, MD for.  Complicated migraine.has Family history of heart attack; Nephrolithiasis; Lichen sclerosus; Depression; Generalized anxiety disorder; Foot pain, bilateral; Colicky RUQ abdominal pain; Obesity (BMI 30.0-34.9); Fatty liver, [place] 11/2018; Cholelithiasis; Hyperglycemia; Sleep difficulties; Elevated LFTs; Complicated migraine; Migraine headache; Postmenopausal; Snoring; Sleep apnea, moderate-snore guard; It band syndrome, right; Fatigue; B12 deficiency; and Lower respiratory tract infection on their problem list.  Also anxiety, depression, kidney stones multiple 416, complex migraines. I reviewed Dr. Rondel Baton notes, patient has a lot going on right now and did not really want to talk about it but reported having a lot of issues with headaches on Ubrelvy and on lamotrigine, has a neurologist in [place] as she had a complicated migraine while at her home at [facility], got connected to neurology there, asked for a local neurologist, has had 2 migraines and no worried almost anything she does will trigger another 1 of these migraines.  She also sees a new therapist.  She is on Pristiq.   Per patient she has about >8 migraine days a month and > 15 total headache days a month for > 1 year, no medication overuse,  can last 8-24 ours, Bernita Raisin helps.  She stopped the NyQuil every night and also stopped any medication overuse.  Patient reports that she has headaches, they can be pulsating, pounding, throbbing, pressure with photophobia, phonophobia, osmophobia, nausea, hurts to move, can last upwards of 24 hours and be moderate to severe, she has had other symptoms which have been attributed to possibly migraine aura (could also be panic attack) where she has been  seen in the emergency room she is completely aware no loss of consciousness or alteration of mental status but she feels rigidity and stiffness in her hands, excess sweating, difficulty communicating although could understand what was happening around her, lasting 30 minutes both precipitated by bending over and then standing up quickly with associated head pain.  No motor activity, tongue biting, loss of bowel or bladder.  Her headaches are in the occipital, bitemporal and retro-orbital areas can be associated with feelings of swelling and hotness associated nausea and vomiting, hurts to move, dizziness.  She also has other headaches which is constant lingering but is located retro-orbital, bitemporal and in her neck and shoulders with fatigue in the neck also with some nausea slight photophobia and dizziness.  If she gets upset and starts crying she feels like her brain is swelling and she cannot go on and keep talking. Feels like her head is going to explode, usually in the setting of something distressing or crying or horrible traffic or anything that can bother you. Sometimes she is listening to music in the car she can't take that input anymore the more stressful the situation is or bad traffic. She has a lot of tightness in the neck. She had a very traumatic event in 2023 that has really rocked her family world and things have gotten worse. She is not sleeping well. Other headaches can be retroorbital and occipital and pulsating/pounding/throbbing, light and sound sensitivity, her extremities get stiff, she can have difficulty communicating but no ams or loc she could understand everything around her. She is very motion sensitive since she was a child. She started having migraines as  a teenager more hormonal then started having them again recently more often and the associated stiffness, difficulting communicating is new. No other focal neurologic deficits, associated symptoms, inciting events or modifiable  factors.   Reviewed notes, labs and imaging from outside physicians, which showed:   Current: lamotrigine(stopped taking), labetalol(Stopped taking), Bernita Raisin, Zofran, Phenergan Discontinued: NyQuil and medication overuse  Medications tried that can be used in migraine management include: Topamax and zonisamide contraindicated due to a history of multiple kidney stones, Lamictal, Ubrelvy, aspirin, Pristiq, ibuprofen, Benadryl, Toradol injections, Lamictal, magnesium, Mobic, Reglan injections, Zofran, promethazine, trazodone, Effexor, Zomig, lamictal, labetalol, sumatriptan, Ubrelvy, cannot take nortriptyline or amitriptyline as she is already on a serotonergic drug Pristiq and addition of these increases risk of serotonin syndrome, labetalol. Aimovig contraindicated due to constipation.   Reviewed chart from [facility]: Patient was seen by a neurologist a week ago at [facility], transitioning to local neurologist here .  She has been following with [facility].  Last seen April 27, 2023 and prior to that December 01, 2022 for complicated migraine and cervicalgia.  She was seen in the emergency room April 10, 2021 for headache, difficulty communicating, excessive sweating, stiffness in the hands, was able to understand what was happening around her, lasted 30 minutes, happened 1 other time in August 2022 though the episode was less severe, both episodes were preceded by her bending over and then standing quickly, each episode did have some associated headache pain, no tongue biting loss of bowel or bladder, in May 2023 she was started on Lamictal 100 mg at bedtime she noted an increase in frequency of headache but a decrease in previous experienced "seizure-like activity".  She has 2 types of headaches the first when she has every other week located in the occipital bitemporal and retro-orbital areas described as pressure swelling and hot, associated nausea vomiting photophobia phonophobia, heat  sensitivity osmophobia, dizziness and some lightheadedness.  They can last 24 hours Ubrelvy helps.  The second headache patient describes as constant lingering headache located retro-orbital, bitemporal and in her neck and shoulders, feeling of weak muscles and fatigue in her neck, she does have some nausea and slight photophobia with this headache as well as with some reported dizziness at times.  On April 23, 2018 for an MRI of the brain was negative.   Review of Systems: Patient complains of symptoms per HPI as well as the following symptoms anxiety. Pertinent negatives and positives per HPI. All others negative.   Social History   Socioeconomic History   Marital status: Married    Spouse name: Not on file   Number of children: Not on file   Years of education: Not on file   Highest education level: Bachelor's degree (e.g., BA, AB, BS)  Occupational History   Not on file  Tobacco Use   Smoking status: Never   Smokeless tobacco: Never  Vaping Use   Vaping status: Never Used  Substance and Sexual Activity   Alcohol use: Yes    Alcohol/week: 7.0 standard drinks of alcohol    Types: 7 Standard drinks or equivalent per week   Drug use: No   Sexual activity: Yes    Partners: Male    Birth control/protection: Post-menopausal  Other Topics Concern   Not on file  Social History Narrative   Not on file   Social Drivers of Health   Financial Resource Strain: Low Risk  (03/09/2023)   Overall Financial Resource Strain (CARDIA)    Difficulty of Paying Living  Expenses: Not hard at all  Food Insecurity: No Food Insecurity (03/09/2023)   Hunger Vital Sign    Worried About Running Out of Food in the Last Year: Never true    Ran Out of Food in the Last Year: Never true  Transportation Needs: No Transportation Needs (03/09/2023)   PRAPARE - Administrator, Civil Service (Medical): No    Lack of Transportation (Non-Medical): No  Physical Activity: Unknown (03/09/2023)    Exercise Vital Sign    Days of Exercise per Week: 0 days    Minutes of Exercise per Session: Not on file  Stress: Stress Concern Present (03/09/2023)   [employer] of Occupational Health - Occupational Stress Questionnaire    Feeling of Stress : Rather much  Social Connections: Socially Integrated (03/09/2023)   Social Connection and Isolation Panel [NHANES]    Frequency of Communication with Friends and Family: More than three times a week    Frequency of Social Gatherings with Friends and Family: Once a week    Attends Religious Services: More than 4 times per year    Active Member of Clubs or Organizations: Yes    Attends Banker Meetings: More than 4 times per year    Marital Status: Married  Catering manager Violence: Not At Risk (07/20/2022)   Received from [facility], [facility]   HITS    Over the last 12 months how often did your partner physically hurt you?: Never    Over the last 12 months how often did your partner insult you or talk down to you?: Rarely    Over the last 12 months how often did your partner threaten you with physical harm?: Never    Over the last 12 months how often did your partner scream or curse at you?: Rarely    Family History  Problem Relation Age of Onset   Hypertension Mother    Thyroid disease Mother    Lung cancer Mother    Throat cancer Mother    Heart disease Father    Heart disease Brother    Kidney cancer Brother    Prostate cancer Brother    Diabetes Paternal Grandfather    Diabetes Other    Colon cancer Neg Hx    Colon polyps Neg Hx    Esophageal cancer Neg Hx    Stomach cancer Neg Hx    Rectal cancer Neg Hx     Past Medical History:  Diagnosis Date   Anxiety    Depression    anxiety   Insomnia    Kidney stones    multiple-4/16   Migraines    complex   Post-operative nausea and vomiting    SUI (stress urinary incontinence, female)     Patient Active Problem List   Diagnosis Date Noted   Lower  respiratory tract infection 03/10/2023   B12 deficiency 08/21/2022   Fatigue 04/27/2022   It band syndrome, right 02/25/2022   Snoring 02/05/2021   Sleep apnea, moderate-snore guard 02/05/2021   Postmenopausal 10/25/2020   Complicated migraine 05/29/2020   Migraine headache 05/29/2020   Elevated LFTs 01/22/2020   Hyperglycemia 12/13/2019   Sleep difficulties 12/13/2019   Fatty liver, [place] 11/2018 12/15/2018   Cholelithiasis 12/15/2018   Foot pain, bilateral 11/29/2018   Colicky RUQ abdominal pain 11/29/2018   Obesity (BMI 30.0-34.9) 11/29/2018   Lichen sclerosus 01/13/2015   Depression 01/13/2015   Generalized anxiety disorder 01/13/2015   Nephrolithiasis 06/20/2014   Family history of heart  attack 01/15/2012    Past Surgical History:  Procedure Laterality Date   COLONOSCOPY     10 years ago in [facility] [place]-normal exam   CYSTOSCOPY/RETROGRADE/URETEROSCOPY/STONE EXTRACTION WITH BASKET  05/2014   Dr. Shelby Dubin   FOOT FRACTURE SURGERY Right 2019   FOOT SURGERY  2005   right foot   LAPAROSCOPIC APPENDECTOMY  2006   LITHOTRIPSY  05/2014   Dr. Shelby Dubin   URETHRAL SLING  03/2009   Dr Sherron Monday     Current Outpatient Medications  Medication Sig Dispense Refill   cyanocobalamin (VITAMIN B12) 1000 MCG/ML injection Inject 1 mL (1,000 mcg total) into the muscle every 30 (thirty) days. 3 mL 3   desvenlafaxine (PRISTIQ) 100 MG 24 hr tablet Take 1 tablet (100 mg total) by mouth daily. 90 tablet 03   fluticasone (FLONASE) 50 MCG/ACT nasal spray daily as needed.     Galcanezumab-gnlm (EMGALITY) 120 MG/ML SOAJ Inject 120 mg into the skin every 30 (thirty) days. 2 mL 0   Galcanezumab-gnlm (EMGALITY) 120 MG/ML SOAJ Inject 120 mg into the skin every 30 (thirty) days. Please use copay card: BIN [id number] PCN PDMI GROUP [id number] ID [id number] EXP 02/23/2024 1.12 mL 11   mometasone (ELOCON) 0.1 % ointment Apply topically daily. 45 g 2   ondansetron (ZOFRAN ODT) 4 MG disintegrating tablet  Take 1 tablet (4 mg total) by mouth every 8 (eight) hours as needed for nausea or vomiting. 20 tablet 2   ondansetron (ZOFRAN) 4 MG tablet Take 1 tablet (4 mg total) by mouth every 8 (eight) hours as needed for nausea or vomiting. 10 tablet 0   Potassium Citrate 15 MEQ (1620 MG) TBCR TAKE 1 TABLET BY MOUTH TWICE A DAY WITH A MEAL 180 tablet 2   promethazine-dextromethorphan (PROMETHAZINE-DM) 6.25-15 MG/5ML syrup Take 5 mLs by mouth 4 (four) times daily as needed. 150 mL 0   SYRINGE-NEEDLE, DISP, 3 ML 25G X 1" 3 ML MISC Use as directed for B12 injections 12 each 0   UBRELVY 100 MG TABS Take by mouth.     No current facility-administered medications for this visit.    Allergies as of 05/04/2023 - Review Complete 05/04/2023  Allergen Reaction Noted   Amoxicillin-pot clavulanate Diarrhea 09/17/2015   Codeine  06/10/2014   Hydrocodone  06/10/2014   Oxycodone Other (See Comments) 04/27/2022   Sulfa antibiotics Nausea And Vomiting 06/14/2012    Vitals: BP 126/81 (BP Location: Right Arm, Patient Position: Sitting, Cuff Size: Normal)   Pulse 77   Ht 5\' 2"  (1.575 m)   Wt 173 lb (78.5 kg)   LMP 02/23/2009   BMI 31.64 kg/m  Last Weight:  Wt Readings from Last 1 Encounters:  05/04/23 173 lb (78.5 kg)   Last Height:   Ht Readings from Last 1 Encounters:  05/04/23 5\' 2"  (1.575 m)     Physical exam: Exam: Gen: NAD, conversant, well nourised, obese, well groomed                     CV: RRR, no MRG. No Carotid Bruits. No peripheral edema, warm, nontender Eyes: Conjunctivae clear without exudates or hemorrhage  Neuro: Detailed Neurologic Exam  Speech:    Speech is normal; fluent and spontaneous with normal comprehension.  Cognition:    The patient is oriented to person, place, and time;     recent and remote memory intact;     language fluent;     normal attention, concentration,  fund of knowledge Cranial Nerves:    The pupils are equal, round, and reactive to light. The fundi  are normal and spontaneous venous pulsations are present. Visual fields are full to finger confrontation. Extraocular movements are intact. Trigeminal sensation is intact and the muscles of mastication are normal. The face is symmetric. The palate elevates in the midline. Hearing intact. Voice is normal. Shoulder shrug is normal. The tongue has normal motion without fasciculations.   Coordination: nml  Gait: nml  Motor Observation:    No asymmetry, no atrophy, and no involuntary movements noted. Tone:    Normal muscle tone.    Posture:    Posture is normal. normal erect    Strength:    Strength is V/V in the upper and lower limbs.      Sensation: intact to LT     Reflex Exam:  DTR's:    Deep tendon reflexes in the upper and lower extremities are normal bilaterally.   Toes:    The toes are downgoing bilaterally.   Clonus:    Clonus is absent.    Assessment/Plan:  Patient with migraines.  - Patient reports that she has migraines that can be pulsating, pounding, throbbing, pressure with photophobia, phonophobia, osmophobia, nausea, can last upwards of 24 hours and be moderate to severe - Migraines are exacerbated by stress and anxiety, for example If she gets upset and starts crying she feels like her brain is swelling and she cannot go on and keep talking. Feels like her head is going to explode, usually in the setting of something distressing or crying or horrible traffic or anything that can bother her. If she gets upset and starts crying she feels like her brain is swelling and she cannot go on and keep talking. Feels like her head is going to explode, usually in the setting of something distressing or crying or horrible traffic or anything that can bother her.  - she has had other symptoms which have been attributed to possibly migraine aura, but sounds more like a panic attack or anxiety, where she has been seen in the emergency room she is completely aware, no loss of  consciousness or alteration of mental status, but she feels rigidity and stiffness in her hands, excess sweating, difficulty communicating although can understand what is happening around her.  - Start emgality for migraine prevention  No orders of the defined types were placed in this encounter.  Meds ordered this encounter  Medications   Galcanezumab-gnlm (EMGALITY) 120 MG/ML SOAJ    Sig: Inject 120 mg into the skin every 30 (thirty) days.    Dispense:  2 mL    Refill:  0    2 injections the first month and then1 injection monthly thereafter   Galcanezumab-gnlm (EMGALITY) 120 MG/ML SOAJ    Sig: Inject 120 mg into the skin every 30 (thirty) days. Please use copay card: BIN [id number] PCN PDMI GROUP [id number] ID [id number] EXP 02/23/2024    Dispense:  1.12 mL    Refill:  11    Please use copay card: BIN [id number] PCN PDMI GROUP [id number] ID [id number] EXP 02/23/2024    Cc: Jerene Bears, MD,  Pincus Sanes, MD  Naomie Dean, MD  [facility] [address] [place], [place] [address]  Phone [phone] Fax [phone]  I spent over 60 minutes of face-to-face and non-face-to-face time with patient on the  1. Chronic migraine without aura without status migrainosus, not intractable    diagnosis.  This included previsit chart review, lab review, study review, order entry, electronic health record documentation, patient education on the different diagnostic and therapeutic options, counseling and coordination of care, risks and benefits of management, compliance, or risk factor reduction

## 2023-05-04 NOTE — Patient Instructions (Signed)
 Start Emgality  Galcanezumab Injection What is this medication? GALCANEZUMAB (gal ka NEZ ue mab) prevents migraines. It works by blocking a substance in the body that causes migraines. It may also be used to treat cluster headaches. It is a monoclonal antibody. This medicine may be used for other purposes; ask your health care provider or pharmacist if you have questions. COMMON BRAND NAME(S): Emgality What should I tell my care team before I take this medication? They need to know if you have any of these conditions: An unusual or allergic reaction to galcanezumab, other medications, foods, dyes, or preservatives Pregnant or trying to get pregnant Breast-feeding How should I use this medication? This medication is injected under the skin. You will be taught how to prepare and give it. Take it as directed on the prescription label. Keep taking it unless your care team tells you to stop. It is important that you put your used needles and syringes in a special sharps container. Do not put them in a trash can. If you do not have a sharps container, call your pharmacist or care team to get one. Talk to your care team about the use of this medication in children. Special care may be needed. Overdosage: If you think you have taken too much of this medicine contact a poison control center or emergency room at once. NOTE: This medicine is only for you. Do not share this medicine with others. What if I miss a dose? If you miss a dose, take it as soon as you can. If it is almost time for your next dose, take only that dose. Do not take double or extra doses. What may interact with this medication? Interactions are not expected. This list may not describe all possible interactions. Give your health care provider a list of all the medicines, herbs, non-prescription drugs, or dietary supplements you use. Also tell them if you smoke, drink alcohol, or use illegal drugs. Some items may interact with your  medicine. What should I watch for while using this medication? Visit your care team for regular checks on your progress. Tell your care team if your symptoms do not start to get better or if they get worse. What side effects may I notice from receiving this medication? Side effects that you should report to your care team as soon as possible: Allergic reactions or angioedema--skin rash, itching or hives, swelling of the face, eyes, lips, tongue, arms, or legs, trouble swallowing or breathing Side effects that usually do not require medical attention (report to your care team if they continue or are bothersome): Pain, redness, or irritation at injection site This list may not describe all possible side effects. Call your doctor for medical advice about side effects. You may report side effects to FDA at 1-800-FDA-1088. Where should I keep my medication? Keep out of the reach of children and pets. Store in a refrigerator or at room temperature between 20 and 25 degrees C (68 and 77 degrees F). Refrigeration (preferred): Store in the refrigerator. Do not freeze. Keep in the original container until you are ready to take it. Remove the dose from the carton about 30 minutes before it is time for you to use it. If the dose is not used, it may be stored in original container at room temperature for 7 days. Get rid of any unused medication after the expiration date. Room Temperature: This medication may be stored at room temperature for up to 7 days. Keep it in the original  container. Protect from light until time of use. If it is stored at room temperature, get rid of any unused medication after 7 days or after it expires, whichever is first. To get rid of medications that are no longer needed or have expired: Take the medication to a medication take-back program. Check with your pharmacy or law enforcement to find a location. If you cannot return the medication, ask your pharmacist or care team how to get  rid of this medication safely. NOTE: This sheet is a summary. It may not cover all possible information. If you have questions about this medicine, talk to your doctor, pharmacist, or health care provider.  2024 Elsevier/Gold Standard (2021-04-07 00:00:00)

## 2023-05-05 MED ORDER — EMGALITY 120 MG/ML ~~LOC~~ SOAJ
120.0000 mg | SUBCUTANEOUS | 11 refills | Status: DC
Start: 2023-05-05 — End: 2023-09-14

## 2023-05-26 ENCOUNTER — Other Ambulatory Visit (HOSPITAL_COMMUNITY): Payer: Self-pay

## 2023-05-26 ENCOUNTER — Telehealth: Payer: Self-pay | Admitting: Pharmacy Technician

## 2023-05-26 NOTE — Telephone Encounter (Signed)
 Pharmacy Patient Advocate Encounter   Received notification from CoverMyMeds that prior authorization for Emgality 120MG /ML auto-injectors (migraine) is required/requested.   Insurance verification completed.   The patient is insured through [employer] .   Per test claim: PA required; PA submitted to above mentioned insurance via CoverMyMeds Key/confirmation #/EOC [facility] Status is pending

## 2023-05-27 ENCOUNTER — Other Ambulatory Visit (HOSPITAL_COMMUNITY): Payer: Self-pay

## 2023-05-27 NOTE — Telephone Encounter (Signed)
 Pharmacy Patient Advocate Encounter  Received notification from [facility] that Prior Authorization for Emgality 120MG /ML auto-injectors (migraine) has been APPROVED from 05/26/2023 to 08/18/2023. Ran test claim, Copay is $35.00. This test claim was processed through [facility]- copay amounts may vary at other pharmacies due to pharmacy/plan contracts, or as the patient moves through the different stages of their insurance plan.   PA #/Case ID/Reference #: [id number]

## 2023-08-11 ENCOUNTER — Encounter (INDEPENDENT_AMBULATORY_CARE_PROVIDER_SITE_OTHER): Payer: Self-pay | Admitting: Neurology

## 2023-08-11 DIAGNOSIS — Z0289 Encounter for other administrative examinations: Secondary | ICD-10-CM

## 2023-08-12 ENCOUNTER — Other Ambulatory Visit: Payer: Self-pay | Admitting: Neurology

## 2023-08-12 MED ORDER — METHYLPREDNISOLONE 4 MG PO TBPK: ORAL_TABLET | ORAL | 1 refills | Status: DC

## 2023-08-12 NOTE — Telephone Encounter (Signed)
 Please see the MyChart message reply(ies) for my assessment and plan.    This patient gave consent for this Medical Advice Message and is aware that it may result in a bill to [employer], as well as the possibility of receiving a bill for a co-payment or deductible. They are an established patient, but are not seeking medical advice exclusively about a problem treated during an in person or video visit in the last seven days. I did not recommend an in person or video visit within seven days of my reply.    I spent a total of 7 minutes cumulative time within 7 days through [employer].  Glory Larsen, MD

## 2023-08-19 ENCOUNTER — Telehealth: Payer: Self-pay | Admitting: Pharmacist

## 2023-08-19 NOTE — Telephone Encounter (Signed)
 Pharmacy Patient Advocate Encounter  Received notification from [facility] that Prior Authorization for Emgality  has been APPROVED from 08/19/2023 to 08/18/2024   PA #/Case ID/Reference #: [id number]

## 2023-08-19 NOTE — Telephone Encounter (Signed)
 Pharmacy Patient Advocate Encounter   Received notification from CoverMyMeds that prior authorization for Emgality  120MG /ML auto-injectors (migraine) is required/requested.   Insurance verification completed.   The patient is insured through [facility] .   Per test claim: PA required; PA submitted to above mentioned insurance via CoverMyMeds Key/confirmation #/EOC [facility] Status is pending

## 2023-09-14 ENCOUNTER — Encounter: Payer: Self-pay | Admitting: Neurology

## 2023-09-14 ENCOUNTER — Ambulatory Visit (INDEPENDENT_AMBULATORY_CARE_PROVIDER_SITE_OTHER): Admitting: Neurology

## 2023-09-14 VITALS — BP 126/82 | HR 96 | Ht 62.0 in | Wt 168.0 lb

## 2023-09-14 DIAGNOSIS — H538 Other visual disturbances: Secondary | ICD-10-CM

## 2023-09-14 DIAGNOSIS — G43711 Chronic migraine without aura, intractable, with status migrainosus: Secondary | ICD-10-CM | POA: Diagnosis not present

## 2023-09-14 DIAGNOSIS — R519 Headache, unspecified: Secondary | ICD-10-CM

## 2023-09-14 DIAGNOSIS — R51 Headache with orthostatic component, not elsewhere classified: Secondary | ICD-10-CM | POA: Diagnosis not present

## 2023-09-14 MED ORDER — AJOVY 225 MG/1.5ML ~~LOC~~ SOAJ: 225.0000 mg | SUBCUTANEOUS | 0 refills | Status: AC

## 2023-09-14 NOTE — Progress Notes (Addendum)
 [facility]    Provider:  Dr Ines Requesting Provider: Geofm Glade PARAS, MD Primary Care Provider:  Geofm Glade PARAS, MD  CC:  Migraines  09/15/2023: States her migraines are terrible. Took emgaity for 3 months and didn't help at all. It is constant on the right. Every day is a new experience, severe, worsening in freq and severity with vision changes, positional quality. In the front of the face, in the temple, below the eyes, behind the eyes, worsening in frequency and severity and in the back of the neck. Ice packs help. Worse when standing, positional, worsening, neck weakness, blurryvision, intractable. Headaches daily with > 15 migraine days a month. No aura. No medication overuse.    MRi brain 04/23/2022; reviewed MRI report.  COMPARISON: CT brain 04/11/2021   FINDINGS:   Brain Parenchyma: No acute infarction. No intracranial  hemorrhage. No mass or mass effect. No midline shift.  Ventricles and Sulci: Normal for age.  Extra-Axial Spaces: No extra-axial fluid collection.  Basal Cistern: Normal.  Intracranial Flow-Voids: Normal.   Paranasal Sinuses: Non-opacified.  Mastoid Sinuses: Well aerated.   Orbits: Normal.  Cranium: No osseous lesions.    IMPRESSION:  NO ETIOLOGY FOR HEADACHE IDENTIFIED.   Reviewed recent labs: urine culture 04/16/2023 abnormal > 100,000 staph epidermis, influenze a/b negative  Patient complains of symptoms per HPI as well as the following symptoms: per HPI . Pertinent negatives and positives per HPI. All others negative   HPI:  Joy Petty is a 63 y.o. female here as requested by Geofm Glade PARAS, MD for.  Complicated migraine.has Family history of heart attack; Nephrolithiasis; Lichen sclerosus; Depression; Generalized anxiety disorder; Foot pain, bilateral; Colicky RUQ abdominal pain; Obesity (BMI 30.0-34.9); Fatty liver, US  11/2018; Cholelithiasis; Hyperglycemia; Sleep difficulties; Elevated LFTs; Complicated  migraine; Migraine headache; Postmenopausal; Snoring; Sleep apnea, moderate-snore guard; It band syndrome, right; Fatigue; B12 deficiency; Lower respiratory tract infection; and Chronic migraine without aura, with intractable migraine, so stated, with status migrainosus on their problem list.  Also anxiety, depression, kidney stones multiple 416, complex migraines. I reviewed Dr. Dianne notes, patient has a lot going on right now and did not really want to talk about it but reported having a lot of issues with headaches on Ubrelvy and on lamotrigine, has a neurologist in [place] as she had a complicated migraine while at her home at [facility], got connected to neurology there, asked for a local neurologist, has had 2 migraines and no worried almost anything she does will trigger another 1 of these migraines.  She also sees a new therapist.  She is on Pristiq .   Per patient she has about >8 migraine days a month and > 15 total headache days a month for > 1 year, no medication overuse,  can last 8-24 ours, Holland helps.  She stopped the NyQuil every night and also stopped any medication overuse.  Patient reports that she has headaches, they can be pulsating, pounding, throbbing, pressure with photophobia, phonophobia, osmophobia, nausea, hurts to move, can last upwards of 24 hours and be moderate to severe, she has had other symptoms which have been attributed to possibly migraine aura (could also be panic attack) where she has been seen in the emergency room she is completely aware no loss of consciousness or alteration of mental status but she feels rigidity and stiffness in her hands, excess sweating, difficulty communicating although could understand what was happening around her, lasting 30 minutes both precipitated by bending over and then standing  up quickly with associated head pain.  No motor activity, tongue biting, loss of bowel or bladder.  Her headaches are in the occipital, bitemporal and  retro-orbital areas can be associated with feelings of swelling and hotness associated nausea and vomiting, hurts to move, dizziness.  She also has other headaches which is constant lingering but is located retro-orbital, bitemporal and in her neck and shoulders with fatigue in the neck also with some nausea slight photophobia and dizziness.  If she gets upset and starts crying she feels like her brain is swelling and she cannot go on and keep talking. Feels like her head is going to explode, usually in the setting of something distressing or crying or horrible traffic or anything that can bother you. Sometimes she is listening to music in the car she can't take that input anymore the more stressful the situation is or bad traffic. She has a lot of tightness in the neck. She had a very traumatic event in 2023 that has really rocked her family world and things have gotten worse. She is not sleeping well. Other headaches can be retroorbital and occipital and pulsating/pounding/throbbing, light and sound sensitivity, her extremities get stiff, she can have difficulty communicating but no ams or loc she could understand everything around her. She is very motion sensitive since she was a child. She started having migraines as a teenager more hormonal then started having them again recently more often and the associated stiffness, difficulting communicating is new. No other focal neurologic deficits, associated symptoms, inciting events or modifiable factors.   Reviewed notes, labs and imaging from outside physicians, which showed:   Current: lamotrigine(stopped taking), labetalol(Stopped taking), Holland, Zofran , Phenergan  Discontinued: NyQuil and medication overuse  Medications tried that can be used in migraine management include: Topamax and zonisamide contraindicated due to a history of multiple kidney stones, Lamictal, Ubrelvy, aspirin, Pristiq , ibuprofen , Benadryl , Toradol  injections, Lamictal,  magnesium, Mobic , Reglan  injections, Zofran , promethazine , trazodone , Effexor , Zomig , lamictal, labetalol, sumatriptan, Ubrelvy, cannot take nortriptyline or amitriptyline as she is already on a serotonergic drug Pristiq  and addition of these increases risk of serotonin syndrome, labetalol. Emgality , aimovig contraindicated due to constipation   Reviewed chart from [facility]: Patient was seen by a neurologist a week ago at [facility], transitioning to local neurologist here .  She has been following with [facility].  Last seen April 27, 2023 and prior to that December 01, 2022 for complicated migraine and cervicalgia.  She was seen in the emergency room April 10, 2021 for headache, difficulty communicating, excessive sweating, stiffness in the hands, was able to understand what was happening around her, lasted 30 minutes, happened 1 other time in August 2022 though the episode was less severe, both episodes were preceded by her bending over and then standing quickly, each episode did have some associated headache pain, no tongue biting loss of bowel or bladder, in May 2023 she was started on Lamictal 100 mg at bedtime she noted an increase in frequency of headache but a decrease in previous experienced seizure-like activity.  She has 2 types of headaches the first when she has every other week located in the occipital bitemporal and retro-orbital areas described as pressure swelling and hot, associated nausea vomiting photophobia phonophobia, heat sensitivity osmophobia, dizziness and some lightheadedness.  They can last 24 hours Ubrelvy helps.  The second headache patient describes as constant lingering headache located retro-orbital, bitemporal and in her neck and shoulders, feeling of weak muscles and fatigue in her neck, she  does have some nausea and slight photophobia with this headache as well as with some reported dizziness at times.  On April 23, 2018 for an MRI of the brain was negative.    Review of Systems: Patient complains of symptoms per HPI as well as the following symptoms anxiety. Pertinent negatives and positives per HPI. All others negative.   Social History   Socioeconomic History   Marital status: Married    Spouse name: Not on file   Number of children: Not on file   Years of education: Not on file   Highest education level: Bachelor's degree (e.g., BA, AB, BS)  Occupational History   Not on file  Tobacco Use   Smoking status: Never   Smokeless tobacco: Never  Vaping Use   Vaping status: Never Used  Substance and Sexual Activity   Alcohol use: Yes    Alcohol/week: 7.0 standard drinks of alcohol    Types: 7 Standard drinks or equivalent per week   Drug use: No   Sexual activity: Yes    Partners: Male    Birth control/protection: Post-menopausal  Other Topics Concern   Not on file  Social History Narrative   Right handed   Caffeine: 3-4 cups/day   Social Drivers of Corporate investment banker Strain: Low Risk  (03/09/2023)   Overall Financial Resource Strain (CARDIA)    Difficulty of Paying Living Expenses: Not hard at all  Food Insecurity: No Food Insecurity (03/09/2023)   Hunger Vital Sign    Worried About Running Out of Food in the Last Year: Never true    Ran Out of Food in the Last Year: Never true  Transportation Needs: No Transportation Needs (03/09/2023)   PRAPARE - Administrator, Civil Service (Medical): No    Lack of Transportation (Non-Medical): No  Physical Activity: Unknown (03/09/2023)   Exercise Vital Sign    Days of Exercise per Week: 0 days    Minutes of Exercise per Session: Not on file  Stress: No Stress Concern Present (07/15/2023)   Received from [facility] of Occupational Health - Occupational Stress Questionnaire    Feeling of Stress : Not at all  Social Connections: Socially Integrated (03/09/2023)   Social Connection and Isolation Panel    Frequency of Communication with Friends  and Family: More than three times a week    Frequency of Social Gatherings with Friends and Family: Once a week    Attends Religious Services: More than 4 times per year    Active Member of [employer] or Organizations: Yes    Attends Engineer, structural: More than 4 times per year    Marital Status: Married  Catering manager Violence: Not At Risk (07/15/2023)   Received from [facility]   HITS    Over the last 12 months how often did your partner physically hurt you?: Never    Over the last 12 months how often did your partner insult you or talk down to you?: Never    Over the last 12 months how often did your partner threaten you with physical harm?: Never    Over the last 12 months how often did your partner scream or curse at you?: Never    Family History  Problem Relation Age of Onset   Hypertension Mother    Thyroid disease Mother    Lung cancer Mother    Throat cancer Mother    Heart disease Father    Heart  disease Brother    Kidney cancer Brother    Prostate cancer Brother    Diabetes Paternal Grandfather    Diabetes Other    Colon cancer Neg Hx    Colon polyps Neg Hx    Esophageal cancer Neg Hx    Stomach cancer Neg Hx    Rectal cancer Neg Hx     Past Medical History:  Diagnosis Date   Anxiety    Depression    anxiety   Insomnia    Kidney stones    multiple-4/16   Migraines    complex   Post-operative nausea and vomiting    SUI (stress urinary incontinence, female)     Patient Active Problem List   Diagnosis Date Noted   Chronic migraine without aura, with intractable migraine, so stated, with status migrainosus 09/14/2023   Lower respiratory tract infection 03/10/2023   B12 deficiency 08/21/2022   Fatigue 04/27/2022   It band syndrome, right 02/25/2022   Snoring 02/05/2021   Sleep apnea, moderate-snore guard 02/05/2021   Postmenopausal 10/25/2020   Complicated migraine 05/29/2020   Migraine headache 05/29/2020   Elevated LFTs 01/22/2020    Hyperglycemia 12/13/2019   Sleep difficulties 12/13/2019   Fatty liver, US  11/2018 12/15/2018   Cholelithiasis 12/15/2018   Foot pain, bilateral 11/29/2018   Colicky RUQ abdominal pain 11/29/2018   Obesity (BMI 30.0-34.9) 11/29/2018   Lichen sclerosus 01/13/2015   Depression 01/13/2015   Generalized anxiety disorder 01/13/2015   Nephrolithiasis 06/20/2014   Family history of heart attack 01/15/2012    Past Surgical History:  Procedure Laterality Date   COLONOSCOPY     10 years ago in [place], [place]-normal exam   CYSTOSCOPY/RETROGRADE/URETEROSCOPY/STONE EXTRACTION WITH BASKET  05/2014   Dr. Lorrene Hurst   FOOT FRACTURE SURGERY Right 2019   FOOT SURGERY  2005   right foot   FOOT SURGERY Right 2025   removed hardware   LAPAROSCOPIC APPENDECTOMY  2006   LITHOTRIPSY  05/2014   Dr. Lorrene Hurst   URETHRAL SLING  03/2009   Dr Gaston     Current Outpatient Medications  Medication Sig Dispense Refill   Coenzyme Q10 (COQ-10) 100 MG capsule Take 100 mg by mouth daily.     desvenlafaxine  (PRISTIQ ) 100 MG 24 hr tablet Take 1 tablet (100 mg total) by mouth daily. 90 tablet 03   fluticasone (FLONASE) 50 MCG/ACT nasal spray daily as needed.     Fremanezumab -vfrm (AJOVY ) 225 MG/1.5ML SOAJ Inject 225 mg into the skin every 30 (thirty) days. 3 mL 0   Fremanezumab -vfrm (AJOVY ) 225 MG/1.5ML SOAJ Inject 225 mg into the skin every 30 (thirty) days. PLEASE RUN COPAY CARD: BIN# [id number] PCN# [id number] GRP# [id number] ID# [id number] EXP 02/23/2024 1.5 mL 11   methylPREDNISolone  (MEDROL  DOSEPAK) 4 MG TBPK tablet Take pills daily all together with food. Take the first dose (6 pills) as soon as possible. Take the rest each morning. For 6 days total 6-5-4-3-2-1. (Patient not taking: Reported on 09/14/2023) 21 tablet 1   ondansetron  (ZOFRAN  ODT) 4 MG disintegrating tablet Take 1 tablet (4 mg total) by mouth every 8 (eight) hours as needed for nausea or vomiting. 20 tablet 2   ondansetron  (ZOFRAN ) 4 MG tablet Take 1  tablet (4 mg total) by mouth every 8 (eight) hours as needed for nausea or vomiting. 10 tablet 0   Potassium Citrate  15 MEQ (1620 MG) TBCR TAKE 1 TABLET BY MOUTH TWICE A DAY WITH A MEAL 180 tablet 2   UBRELVY 100  MG TABS Take by mouth.     UNABLE TO FIND Med Name: CardioMiracle     botulinum toxin Type A (BOTOX) 200 units injection Provider to inject 155 units into the muscles of the head and neck every 12 weeks. Discard remainder. 1 each 3   cyanocobalamin  (VITAMIN B12) 1000 MCG/ML injection INJECT 1ML INTRAMUSCULARLY EVERY 30 DAYS 3 mL 3   mometasone  (ELOCON ) 0.1 % ointment Apply topically daily. (Patient not taking: Reported on 09/14/2023) 45 g 2   promethazine -dextromethorphan (PROMETHAZINE -DM) 6.25-15 MG/5ML syrup Take 5 mLs by mouth 4 (four) times daily as needed. (Patient not taking: Reported on 09/14/2023) 150 mL 0   SYRINGE-NEEDLE, DISP, 3 ML 25G X 1 3 ML MISC Use as directed for B12 injections (Patient not taking: Reported on 09/14/2023) 12 each 0   No current facility-administered medications for this visit.    Allergies as of 09/14/2023 - Review Complete 09/14/2023  Allergen Reaction Noted   Amoxicillin-pot clavulanate Diarrhea 09/17/2015   Codeine  06/10/2014   Hydrocodone  06/10/2014   Oxycodone  Other (See Comments) 04/27/2022   Sulfa antibiotics Nausea And Vomiting 06/14/2012    Vitals: BP 126/82 (BP Location: Right Arm, Patient Position: Sitting, Cuff Size: Normal)   Pulse 96   Ht 5' 2 (1.575 m)   Wt 168 lb (76.2 kg)   LMP 02/23/2009   BMI 30.73 kg/m  Last Weight:  Wt Readings from Last 1 Encounters:  09/14/23 168 lb (76.2 kg)   Last Height:   Ht Readings from Last 1 Encounters:  09/14/23 5' 2 (1.575 m)    Physical exam: Exam: Gen: NAD, conversant      CV: No palpitations or chest pain or SOB. VS: Breathing at a normal rate. obese. Not febrile. Eyes: Conjunctivae clear without exudates or hemorrhage  Neuro: Detailed Neurologic Exam  Speech:    Speech  is normal; fluent and spontaneous with normal comprehension.  Cognition:    The patient is oriented to person, place, and time;     recent and remote memory intact;     language fluent;     normal attention, concentration, fund of knowledge Cranial Nerves:    The pupils are equal, round, and reactive to light. Visual fields are full Extraocular movements are intact.  The face is symmetric with normal sensation. The palate elevates in the midline. Hearing intact. Voice is normal. Shoulder shrug is normal. The tongue has normal motion without fasciculations.   Coordination: normal  Gait:    No abnormalities noted or reported  Motor Observation:   no involuntary movements noted. Tone:    Appears normal  Posture:    Posture is normal. normal erect    Strength:    Strength is anti-gravity and symmetric in the upper and lower limbs.      Sensation: intact to LT, no reports of numbness or tingling or paresthesias         Assessment/Plan:  Patient with migraines associated with stress and anxiety MRi 04/23/2022 for similar was negative for etiology. However due to wrosening symptoms, intractable headache refractory to medication will order MRI. Patient reports that she has migraines that can be pulsating, pounding, throbbing, pressure with photophobia, phonophobia, osmophobia, nausea, can last upwards of 24 hours and be moderate to severe.  Medications tried that can be used in migraine management include: Topamax and zonisamide contraindicated due to a history of multiple kidney stones, Lamictal, Ubrelvy, aspirin, Pristiq , ibuprofen , Benadryl , Toradol  injections, Lamictal, magnesium, Mobic , Reglan  injections, Zofran , promethazine , trazodone ,  Effexor , Zomig , lamictal, labetalol, sumatriptan, Ubrelvy, cannot take nortriptyline or amitriptyline as she is already on a serotonergic drug Pristiq  and addition of these increases risk of serotonin syndrome, labetalol. Emgality , aimovig  contraindicated due to constipation  Start Ajovy  (botox too expensive per patient)  MRI brain: MRI brain due to concerning symptoms of positional headaches,vision changes, worsening headaches  to look for space occupying mass, chiari or intracranial hypertension (pseudotumor), strokes, malignancies, vasculidities, demyelination(multiple sclerosis) or other Migraines are exacerbated by stress and anxiety, for example If she gets upset and starts crying she feels like her brain is swelling and she cannot go on and keep talking. Feels like her head is going to explode, usually in the setting of something distressing or crying or horrible traffic or anything that can bother her. If she gets upset and starts crying she feels like her brain is swelling and she cannot go on and keep talking. Feels like her head is going to explode, usually in the setting of something distressing or crying or horrible traffic or anything that can bother her. Discussed managing stress and situational anxiety/depression, getting therapy and psychiatry she has had other symptoms which have been attributed to possibly migraine aura, but sounds more like a panic attack or anxiety, where she has been seen in the emergency room she is completely aware, no loss of consciousness or alteration of mental status, but she feels rigidity and stiffness in her hands, excess sweating, difficulty communicating although can understand what is happening around her. Again, psychiatry and therapy and exploring functional causes. Emgality  ineffective; Options for treatment: Emgality  was ineffective however we still have Ajovy  and Aimovig (monthly injections). Other options include Vyepti 3 month infusion. Qulipta daily pill. Botox for migraines. Botox approval and Ajovy  samples to bridge.   No orders of the defined types were placed in this encounter.  Meds ordered this encounter  Medications   Fremanezumab -vfrm (AJOVY ) 225 MG/1.5ML SOAJ    Sig:  Inject 225 mg into the skin every 30 (thirty) days.    Dispense:  3 mL    Refill:  0   Fremanezumab -vfrm (AJOVY ) 225 MG/1.5ML SOAJ    Sig: Inject 225 mg into the skin every 30 (thirty) days. PLEASE RUN COPAY CARD: BIN# [id number] PCN# [id number] GRP# [id number] ID# [id number] EXP 02/23/2024    Dispense:  1.5 mL    Refill:  11    PLEASE RUN COPAY CARD: BIN# [id number] PCN# [id number] GRP# [id number] ID# [id number] EXP 02/23/2024    Cc: Geofm Glade PARAS, MD,  Geofm Glade PARAS, MD  Onetha Epp, MD  [facility] [address] [place], [place] [address]  Phone [phone] Fax [phone]

## 2023-09-14 NOTE — Patient Instructions (Addendum)
 MRI brain w/wo contrast Options for treatment: Emgality  was ineffective however we still have Ajovy  and Aimovig (monthly injections). Other options include Vyepti 3 month infusion. Qulipta daily pill. Botox for migraines.

## 2023-09-15 NOTE — Addendum Note (Signed)
 Addended by: Clydie Dillen B on: 09/15/2023 05:59 PM   Modules accepted: Level of Service

## 2023-09-16 ENCOUNTER — Telehealth: Payer: Self-pay | Admitting: *Deleted

## 2023-09-16 ENCOUNTER — Telehealth: Payer: Self-pay | Admitting: Neurology

## 2023-09-16 DIAGNOSIS — G43711 Chronic migraine without aura, intractable, with status migrainosus: Secondary | ICD-10-CM

## 2023-09-16 NOTE — Telephone Encounter (Signed)
-----   Message from Onetha KATHEE Epp sent at 09/15/2023  4:45 PM EDT ----- Regarding: G43.711: please start botox protocol for patient, she can go to an NP thanks G43.711: please start botox protocol for patient, she can go to an NP and be set up with a 6-7 month follow up aith me.  thanks

## 2023-09-16 NOTE — Telephone Encounter (Signed)
 Completed [employer] PA form and faxed with notes to [phone].

## 2023-09-19 ENCOUNTER — Other Ambulatory Visit: Payer: Self-pay | Admitting: Internal Medicine

## 2023-09-22 MED ORDER — ONABOTULINUMTOXINA 200 UNITS IJ SOLR: INTRAMUSCULAR | 3 refills | Status: DC

## 2023-09-22 NOTE — Telephone Encounter (Signed)
 Auth was approved, please send rx to [facility].  Auth#: [id number] (09/16/23-03/02/24)

## 2023-09-22 NOTE — Addendum Note (Signed)
 Addended by: HILLIARD HEATHER CROME on: 09/22/2023 10:05 AM   Modules accepted: Orders

## 2023-09-22 NOTE — Telephone Encounter (Signed)
Botox 200 unit Rx sent to [facility].  

## 2023-10-12 ENCOUNTER — Ambulatory Visit (INDEPENDENT_AMBULATORY_CARE_PROVIDER_SITE_OTHER)

## 2023-10-12 ENCOUNTER — Other Ambulatory Visit: Payer: Self-pay | Admitting: Neurology

## 2023-10-12 DIAGNOSIS — R519 Headache, unspecified: Secondary | ICD-10-CM

## 2023-10-12 DIAGNOSIS — G43711 Chronic migraine without aura, intractable, with status migrainosus: Secondary | ICD-10-CM

## 2023-10-12 DIAGNOSIS — H538 Other visual disturbances: Secondary | ICD-10-CM

## 2023-10-12 DIAGNOSIS — R51 Headache with orthostatic component, not elsewhere classified: Secondary | ICD-10-CM

## 2023-10-14 ENCOUNTER — Ambulatory Visit: Payer: Self-pay | Admitting: Neurology

## 2023-10-26 NOTE — Telephone Encounter (Signed)
 Submitted auth request to new [facility] Rx plan, status is pending. Key: [id number]

## 2023-11-01 MED ORDER — ONABOTULINUMTOXINA 200 UNITS IJ SOLR: INTRAMUSCULAR | 3 refills | Status: AC

## 2023-11-01 NOTE — Telephone Encounter (Addendum)
 Received approval from [facility], please send rx to [facility] phone # [phone]  Auth#: [id number] (10/12/23 until further notice)

## 2023-11-01 NOTE — Telephone Encounter (Signed)
 Botox 200 unit Rx sent to [facility] in [place].

## 2023-11-01 NOTE — Addendum Note (Signed)
 Addended by: HILLIARD HEATHER CROME on: 11/01/2023 10:10 AM   Modules accepted: Orders

## 2023-11-03 ENCOUNTER — Other Ambulatory Visit: Payer: Self-pay | Admitting: Neurology

## 2023-11-03 MED ORDER — AJOVY 225 MG/1.5ML ~~LOC~~ SOAJ
225.0000 mg | SUBCUTANEOUS | 11 refills | Status: AC
Start: 2023-11-03 — End: ?

## 2023-11-03 NOTE — Telephone Encounter (Signed)
 Please let her know I prescribed the Ajovy , also make sure the pharmacy runs the copay card to get the copay as low as possible(included in the prescription)

## 2023-11-03 NOTE — Telephone Encounter (Signed)
 Delivery is pending pt consent.

## 2023-11-03 NOTE — Telephone Encounter (Signed)
 See below, pt may be cancelling Botox due to cost. She is requesting a prescription for Ajovy  to be sent in.

## 2023-11-03 NOTE — Addendum Note (Signed)
 Addended by: Shizuko Wojdyla B on: 11/03/2023 10:25 AM   Modules accepted: Orders

## 2023-11-08 ENCOUNTER — Ambulatory Visit: Admitting: Adult Health

## 2023-11-17 ENCOUNTER — Telehealth: Payer: Self-pay

## 2023-11-17 ENCOUNTER — Other Ambulatory Visit (HOSPITAL_COMMUNITY): Payer: Self-pay

## 2023-11-17 NOTE — Telephone Encounter (Signed)
 Pharmacy Patient Advocate Encounter   Received notification from Fax that prior authorization for Ajovy  is required/requested.   Insurance verification completed.   The patient is insured through [facility] .   Per test claim: PA required; PA started via CoverMyMeds. KEY [id number] . Waiting for clinical questions to populate.

## 2023-11-18 ENCOUNTER — Ambulatory Visit: Admitting: Adult Health

## 2023-11-18 ENCOUNTER — Other Ambulatory Visit (HOSPITAL_COMMUNITY): Payer: Self-pay

## 2023-11-18 NOTE — Telephone Encounter (Signed)
 Pharmacy Patient Advocate Encounter  Received notification from [facility] that Prior Authorization for Ajovy  has been APPROVED from 11/17/2023 to 02/22/2098. Ran test claim, Copay is $623.19. This test claim was processed through [facility]- copay amounts may vary at other pharmacies due to pharmacy/plan contracts, or as the patient moves through the different stages of their insurance plan.   PA #/Case ID/Reference #: [id number]

## 2023-12-24 ENCOUNTER — Encounter (HOSPITAL_BASED_OUTPATIENT_CLINIC_OR_DEPARTMENT_OTHER): Payer: Self-pay | Admitting: Obstetrics & Gynecology

## 2023-12-27 ENCOUNTER — Ambulatory Visit (HOSPITAL_BASED_OUTPATIENT_CLINIC_OR_DEPARTMENT_OTHER): Payer: BC Managed Care – PPO | Admitting: Obstetrics & Gynecology

## 2023-12-29 ENCOUNTER — Encounter (HOSPITAL_BASED_OUTPATIENT_CLINIC_OR_DEPARTMENT_OTHER): Payer: Self-pay | Admitting: Obstetrics & Gynecology

## 2023-12-29 ENCOUNTER — Other Ambulatory Visit (HOSPITAL_COMMUNITY)
Admission: RE | Admit: 2023-12-29 | Discharge: 2023-12-29 | Disposition: A | Discharge status: Home / Self Care | Source: Ambulatory Visit | Attending: Obstetrics & Gynecology | Admitting: Obstetrics & Gynecology

## 2023-12-29 ENCOUNTER — Ambulatory Visit (HOSPITAL_BASED_OUTPATIENT_CLINIC_OR_DEPARTMENT_OTHER): Admitting: Obstetrics & Gynecology

## 2023-12-29 VITALS — BP 120/92 | HR 87 | Ht 62.0 in | Wt 169.0 lb

## 2023-12-29 DIAGNOSIS — Z8659 Personal history of other mental and behavioral disorders: Secondary | ICD-10-CM | POA: Diagnosis not present

## 2023-12-29 DIAGNOSIS — Z124 Encounter for screening for malignant neoplasm of cervix: Secondary | ICD-10-CM

## 2023-12-29 DIAGNOSIS — M85851 Other specified disorders of bone density and structure, right thigh: Secondary | ICD-10-CM

## 2023-12-29 DIAGNOSIS — F32A Depression, unspecified: Secondary | ICD-10-CM | POA: Diagnosis not present

## 2023-12-29 DIAGNOSIS — Z1231 Encounter for screening mammogram for malignant neoplasm of breast: Secondary | ICD-10-CM | POA: Diagnosis not present

## 2023-12-29 DIAGNOSIS — Z01419 Encounter for gynecological examination (general) (routine) without abnormal findings: Secondary | ICD-10-CM

## 2023-12-29 DIAGNOSIS — M85852 Other specified disorders of bone density and structure, left thigh: Secondary | ICD-10-CM | POA: Diagnosis not present

## 2023-12-29 DIAGNOSIS — Z1151 Encounter for screening for human papillomavirus (HPV): Secondary | ICD-10-CM | POA: Diagnosis not present

## 2023-12-29 DIAGNOSIS — L900 Lichen sclerosus et atrophicus: Secondary | ICD-10-CM | POA: Diagnosis not present

## 2023-12-29 MED ORDER — MOMETASONE FUROATE 0.1 % EX OINT: TOPICAL_OINTMENT | CUTANEOUS | 2 refills | Status: AC

## 2023-12-29 MED ORDER — DESVENLAFAXINE SUCCINATE ER 100 MG PO TB24: 100.0000 mg | ORAL_TABLET | Freq: Every day | ORAL | 3 refills | Status: AC

## 2023-12-29 NOTE — Progress Notes (Signed)
 Breast and Pelvic Exam Patient name: Joy Petty MRN [id number]  Date of birth: [birth year] Chief Complaint:   No specific complaints today  History of Present Illness:   Joy Petty is a 63 y.o. G21P0012 Caucasian female being seen today for breast and pelvic exam.  Denies vaginal bleeding.  Son and daughter in law moved to [place].  Daughter and son in law got married and live in [place].    Does need RF for prestiq.  Also needs suggestion for neurologist as Dr. Ines is not with [facility] any longer.    Patient's last menstrual period was 02/23/2009.   Last pap 10/23/2020. Results were: NILM w/ HRHPV negative. H/O abnormal pap: yes Last mammogram: 11/06/2020. Results were: normal. Family h/o breast cancer: no Last colonoscopy: 02/27/2021. Results were: abnormal 2 polyps. Family h/o colorectal cancer: no.  F/u 3 years.   Dexa:  2020 mild osteopenia.       12/29/2023    8:57 AM 03/10/2023    3:26 PM 12/15/2022    9:53 AM 08/21/2022    9:31 AM 04/27/2022   10:22 AM  Depression screen PHQ 2/9  Decreased Interest 1 0 0 0 0  Down, Depressed, Hopeless 1 0 0 0 0  PHQ - 2 Score 2 0 0 0 0  Altered sleeping 2 0   0  Tired, decreased energy 2 0   0  Change in appetite 1 0   0  Feeling bad or failure about yourself  1 0   0  Trouble concentrating 0 0   0  Moving slowly or fidgety/restless 0 0   0  Suicidal thoughts 0 0   0  PHQ-9 Score 8  0    0   Difficult doing work/chores  Not difficult at all   Not difficult at all     Data saved with a previous flowsheet row definition     Review of Systems:   Pertinent items are noted in HPI Denies any vagina bleeding, urinary symptoms, bowel changes, pelvic pain Pertinent History Reviewed:  Reviewed past medical,surgical, social and family history.  Reviewed problem list, medications and allergies. Physical Assessment:   Vitals:   12/29/23 0842  BP: (!) 120/92  Pulse: 87  SpO2: 100%   Weight: 169 lb (76.7 kg)  Height: 5' 2 (1.575 m)  Body mass index is 30.91 kg/m.        Physical Examination:   General appearance - well appearing, and in no distress  Mental status - alert, oriented to person, place, and time  Psych:  She has a normal mood and affect  Skin - warm and dry, normal color, no suspicious lesions noted  Chest - effort normal, all lung fields clear to auscultation bilaterally  Heart - normal rate and regular rhythm  Neck:  midline trachea, no thyromegaly or nodules  Breasts - breasts appear normal, no suspicious masses, no skin or nipple changes or  axillary nodes  Abdomen - soft, nontender, nondistended, no masses or organomegaly  Pelvic - VULVA: normal appearing vulva with no masses, tenderness or lesions   VAGINA: normal appearing vagina with normal color and discharge, no lesions   CERVIX: normal appearing cervix without discharge or lesions, no CMT  Thin prep pap is updated  UTERUS: uterus is felt to be normal size, shape, consistency and nontender   ADNEXA: No adnexal masses or tenderness noted.  Rectal - normal rectal, good sphincter tone, no masses felt  Extremities:  No swelling or varicosities noted  Chaperone present for exam  No results found for this or any previous visit (from the past 24 hours).  Assessment & Plan:  1. Encntr for gyn exam (general) (routine) w/o abn findings (Primary) - Pap smear updated today - Mammogram 11/06/2020 - Colonoscopy 02/27/2021 - Bone mineral density 2020 with mild osteopenia.  Will update this next year. - lab work done with PCP, Dr. Geofm - vaccines reviewed/updated  2. Cervical cancer screening - Cytology - PAP( [facility])  3. History of depression - desvenlafaxine  (PRISTIQ ) 100 MG 24 hr tablet; Take 1 tablet (100 mg total) by mouth daily.  Dispense: 90 tablet; Refill: 3  4. Encounter for screening mammogram for malignant neoplasm of breast - MM 3D SCREENING MAMMOGRAM BILATERAL BREAST;  Future  5. Osteopenia of necks of both femurs - DG Bone Density; Future   Orders Placed This Encounter  Procedures   MM 3D SCREENING MAMMOGRAM BILATERAL BREAST   DG Bone Density    Meds:  Meds ordered this encounter  Medications   desvenlafaxine  (PRISTIQ ) 100 MG 24 hr tablet    Sig: Take 1 tablet (100 mg total) by mouth daily.    Dispense:  90 tablet    Refill:  3   mometasone  (ELOCON ) 0.1 % ointment    Sig: Apply topically daily for next 7 -10 days and then no more than twice weekly.    Dispense:  45 g    Refill:  2    Follow-up: 1-2 years  Ronal GORMAN Pinal, MD 01/01/2024 9:57 PM

## 2023-12-30 LAB — CYTOLOGY - PAP: Diagnosis: NEGATIVE

## 2024-01-01 ENCOUNTER — Ambulatory Visit (HOSPITAL_BASED_OUTPATIENT_CLINIC_OR_DEPARTMENT_OTHER): Payer: Self-pay | Admitting: Obstetrics & Gynecology

## 2024-02-08 ENCOUNTER — Encounter (HOSPITAL_BASED_OUTPATIENT_CLINIC_OR_DEPARTMENT_OTHER): Payer: Self-pay

## 2024-02-08 ENCOUNTER — Inpatient Hospital Stay (HOSPITAL_BASED_OUTPATIENT_CLINIC_OR_DEPARTMENT_OTHER)
Admission: RE | Admit: 2024-02-08 | Discharge: 2024-02-08 | Disposition: A | Source: Ambulatory Visit | Attending: Obstetrics & Gynecology | Admitting: Obstetrics & Gynecology

## 2024-02-08 DIAGNOSIS — Z1231 Encounter for screening mammogram for malignant neoplasm of breast: Secondary | ICD-10-CM | POA: Insufficient documentation

## 2024-02-16 ENCOUNTER — Other Ambulatory Visit (HOSPITAL_BASED_OUTPATIENT_CLINIC_OR_DEPARTMENT_OTHER): Payer: Self-pay | Admitting: Obstetrics & Gynecology

## 2024-02-16 DIAGNOSIS — Z8659 Personal history of other mental and behavioral disorders: Secondary | ICD-10-CM

## 2024-03-30 ENCOUNTER — Encounter: Payer: Self-pay | Admitting: Neurology

## 2024-05-10 ENCOUNTER — Ambulatory Visit: Admitting: Neurology
